# Patient Record
Sex: Male | Born: 1990 | Race: Black or African American | Hispanic: No | Marital: Single | Smoking: Former smoker
Health system: Southern US, Community
[De-identification: ages and names within clinical notes are randomized; demographics above are authoritative.]

## PROBLEM LIST (undated history)

## (undated) DIAGNOSIS — J45909 Unspecified asthma, uncomplicated: Secondary | ICD-10-CM

---
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
## 2011-05-13 ENCOUNTER — Emergency Department: Payer: Self-pay | Admitting: Emergency Medicine

## 2012-01-04 ENCOUNTER — Emergency Department: Payer: Self-pay | Admitting: Emergency Medicine

## 2012-01-05 LAB — COMPREHENSIVE METABOLIC PANEL
Albumin: 5.2 g/dL (ref 3.8–5.6)
Alkaline Phosphatase: 86 U/L — ABNORMAL LOW (ref 98–317)
Anion Gap: 13 (ref 7–16)
BUN: 18 mg/dL (ref 7–18)
Calcium, Total: 9.9 mg/dL (ref 9.0–10.7)
Creatinine: 1.95 mg/dL — ABNORMAL HIGH (ref 0.60–1.30)
Glucose: 112 mg/dL — ABNORMAL HIGH (ref 65–99)
Osmolality: 286 (ref 275–301)
Potassium: 3.3 mmol/L — ABNORMAL LOW (ref 3.5–5.1)
Sodium: 142 mmol/L (ref 136–145)
Total Protein: 8.3 g/dL (ref 6.4–8.6)

## 2012-01-05 LAB — URINALYSIS, COMPLETE
Leukocyte Esterase: NEGATIVE
Nitrite: NEGATIVE
Ph: 5 (ref 4.5–8.0)
Protein: 30
RBC,UR: 2 /HPF (ref 0–5)
Specific Gravity: 1.018 (ref 1.003–1.030)
WBC UR: 5 /HPF (ref 0–5)

## 2012-01-05 LAB — CBC
MCV: 91 fL (ref 80–100)
Platelet: 171 10*3/uL (ref 150–440)
RDW: 12.5 % (ref 11.5–14.5)
WBC: 20.7 10*3/uL — ABNORMAL HIGH (ref 3.8–10.6)

## 2012-01-05 LAB — BASIC METABOLIC PANEL
Anion Gap: 9 (ref 7–16)
BUN: 16 mg/dL (ref 7–18)
Calcium, Total: 8.2 mg/dL — ABNORMAL LOW (ref 9.0–10.7)
Chloride: 110 mmol/L — ABNORMAL HIGH (ref 98–107)
Co2: 25 mmol/L (ref 21–32)
Creatinine: 1.36 mg/dL — ABNORMAL HIGH (ref 0.60–1.30)
EGFR (African American): >60
EGFR (Non-African Amer.): >60
Glucose: 143 mg/dL — ABNORMAL HIGH (ref 65–99)
Osmolality: 290 (ref 275–301)

## 2012-01-05 LAB — CK: CK, Total: 277 U/L — ABNORMAL HIGH (ref 35–232)

## 2012-08-08 ENCOUNTER — Emergency Department: Payer: Self-pay | Admitting: Emergency Medicine

## 2012-08-08 LAB — URINALYSIS, COMPLETE
Glucose,UR: NEGATIVE mg/dL (ref 0–75)
Nitrite: NEGATIVE
Ph: 7 (ref 4.5–8.0)
Squamous Epithelial: <1

## 2013-09-30 ENCOUNTER — Emergency Department: Payer: Self-pay | Admitting: Emergency Medicine

## 2016-07-26 ENCOUNTER — Encounter: Payer: Self-pay | Admitting: Emergency Medicine

## 2016-07-26 ENCOUNTER — Emergency Department
Admission: EM | Admit: 2016-07-26 | Discharge: 2016-07-26 | Disposition: A | Discharge status: Home / Self Care | Payer: Self-pay | Attending: Emergency Medicine | Admitting: Emergency Medicine

## 2016-07-26 DIAGNOSIS — F17200 Nicotine dependence, unspecified, uncomplicated: Secondary | ICD-10-CM | POA: Insufficient documentation

## 2016-07-26 DIAGNOSIS — J111 Influenza due to unidentified influenza virus with other respiratory manifestations: Secondary | ICD-10-CM | POA: Insufficient documentation

## 2016-07-26 MED ORDER — OSELTAMIVIR PHOSPHATE 75 MG PO CAPS: 75.0000 mg | ORAL_CAPSULE | Freq: Two times a day (BID) | ORAL | 0 refills | Status: AC

## 2016-07-26 MED ORDER — GUAIFENESIN-CODEINE 100-10 MG/5ML PO SOLN: 5.0000 mL | ORAL | 0 refills | Status: DC | PRN

## 2016-07-26 NOTE — ED Notes (Signed)
Pt states sore throat, cough since Sunday. Congestion and back pain. Denies fever, N&V, diarrhea.

## 2016-07-26 NOTE — ED Provider Notes (Signed)
[facility] Emergency Department Provider Note  ____________________________________________   First MD Initiated Contact with Patient 07/26/16 1147     (approximate)  I have reviewed the triage vital signs and the nursing notes.   HISTORY  Chief Complaint flu like symptoms    HPI Francisco Mclaughlin is a 26 y.o. male is here with complaint of chills, cough, congestion, body aches and headache. Patient states that his symptoms began yesterday suddenly. He has been exposed to influenza. Patient denies any nausea, vomiting or diarrhea. Patient does continue to drink fluids. He has been taking over-the-counter medication for his symptoms without any improvement. He rates his pain as an 8 out of 10.   History reviewed. No pertinent past medical history.  There are no active problems to display for this patient.   History reviewed. No pertinent surgical history.  Prior to Admission medications   Medication Sig Start Date End Date Taking? Authorizing Provider  guaiFENesin-codeine 100-10 MG/5ML syrup Take 5 mLs by mouth every 4 (four) hours as needed. 07/26/16   Tommi Rumpshonda L Summers, PA-C  oseltamivir (TAMIFLU) 75 MG capsule Take 1 capsule (75 mg total) by mouth 2 (two) times daily. 07/26/16 07/31/16  Tommi Rumpshonda L Summers, PA-C    Allergies Patient has no allergy information on record.  History reviewed. No pertinent family history.  Social History Social History  Substance Use Topics  . Smoking status: Current Every Day Smoker  . Smokeless tobacco: Never Used  . Alcohol use Yes    Review of Systems Constitutional: No fever/Positive chills Eyes: No visual changes.  ENT: No sore throat. Positive nasal congestion. Cardiovascular: Denies chest pain. Respiratory: Denies shortness of breath. As a nonproductive cough. Gastrointestinal: No abdominal pain.  No nausea, no vomiting.  No diarrhea.   Musculoskeletal: Positive for generalized body aches. Skin: Negative  for rash. Neurological: Positive for headaches, focal weakness or numbness.  10-point ROS otherwise negative.  ____________________________________________   PHYSICAL EXAM:  VITAL SIGNS: ED Triage Vitals [07/26/16 1116]  Enc Vitals Group     BP 121/69     Pulse Rate 90     Resp 14     Temp 98.7 F (37.1 C)     Temp Source Oral     SpO2 98 %     Weight 150 lb (68 kg)     Height 5\' 7"  (1.702 m)     Head Circumference      Peak Flow      Pain Score 8     Pain Loc      Pain Edu?      Excl. in GC?     Constitutional: Alert and oriented. Well appearing and in no acute distress. Eyes: Conjunctivae are normal. PERRL. EOMI. Head: Atraumatic. Nose: Moderate congestion/rhinnorhea.  EACs and TMs are clear bilaterally. Mouth/Throat: Mucous membranes are moist.  Oropharynx non-erythematous. Neck: No stridor.  Hematological/Lymphatic/Immunilogical: No cervical lymphadenopathy. Cardiovascular: Normal rate, regular rhythm. Grossly normal heart sounds.  Good peripheral circulation. Respiratory: Normal respiratory effort.  No retractions. Lungs CTAB. Gastrointestinal: Soft and nontender. No distention. Musculoskeletal: Moves upper and lower extremities that any difficulty. Normal gait was noted. Neurologic:  Normal speech and language. No gross focal neurologic deficits are appreciated. No gait instability. Skin:  Skin is warm, dry and intact. No rash noted. Psychiatric: Mood and affect are normal. Speech and behavior are normal.  ____________________________________________   LABS (all labs ordered are listed, but only abnormal results are displayed)  Labs Reviewed - No  data to display   PROCEDURES  Procedure(s) performed: None  Procedures  Critical Care performed: No  ____________________________________________   INITIAL IMPRESSION / ASSESSMENT AND PLAN / ED COURSE  Pertinent labs & imaging results that were available during my care of the patient were reviewed by me  and considered in my medical decision making (see chart for details).  Patient was treated for influenza based on his symptoms and the sudden onset. Patient was given a prescription for Tamiflu 75 mg twice a day for 5 days.  Patient was also given prescription for Robitussin-AC as needed for cough and congestion. He is to follow-up with his primary care doctor or [facility] if any continued problems. He is also aware that he needs to increase fluids and continue taking Tylenol or ibuprofen as needed for body aches, fever, headache.      ____________________________________________   FINAL CLINICAL IMPRESSION(S) / ED DIAGNOSES  Final diagnoses:  Influenza      NEW MEDICATIONS STARTED DURING THIS VISIT:  Discharge Medication List as of 07/26/2016 12:11 PM    START taking these medications   Details  guaiFENesin-codeine 100-10 MG/5ML syrup Take 5 mLs by mouth every 4 (four) hours as needed., Starting Tue 07/26/2016, Print    oseltamivir (TAMIFLU) 75 MG capsule Take 1 capsule (75 mg total) by mouth 2 (two) times daily., Starting Tue 07/26/2016, Until Sun 07/31/2016, Print         Note:  This document was prepared using Dragon voice recognition software and may include unintentional dictation errors.    Tommi Rumps, PA-C 07/26/16 1459    Jene Every, MD 07/26/16 [phone]

## 2016-07-26 NOTE — ED Triage Notes (Signed)
C/o chills, cough, congestion body aches, and headache since Monday.  No distress at this time.

## 2016-07-26 NOTE — Discharge Instructions (Signed)
Follow-up with your primary care doctor if any continued problems. U am a also follow-up with [facility] if any continued problems. Increase fluids. Tylenol or ibuprofen as needed for pain, headache, muscle aches or fever. Begin taking Tamiflu 1 twice a day for 5 days. Robitussin-AC as needed for cough and congestion.

## 2016-10-09 ENCOUNTER — Emergency Department
Admission: EM | Admit: 2016-10-09 | Discharge: 2016-10-09 | Disposition: A | Discharge status: Home / Self Care | Payer: Self-pay | Attending: Student in an Organized Health Care Education/Training Program | Admitting: Student in an Organized Health Care Education/Training Program

## 2016-10-09 DIAGNOSIS — E860 Dehydration: Secondary | ICD-10-CM | POA: Insufficient documentation

## 2016-10-09 DIAGNOSIS — R112 Nausea with vomiting, unspecified: Secondary | ICD-10-CM

## 2016-10-09 DIAGNOSIS — F17200 Nicotine dependence, unspecified, uncomplicated: Secondary | ICD-10-CM | POA: Insufficient documentation

## 2016-10-09 DIAGNOSIS — R197 Diarrhea, unspecified: Secondary | ICD-10-CM

## 2016-10-09 LAB — URINALYSIS, COMPLETE (UACMP) WITH MICROSCOPIC
BACTERIA UA: NONE SEEN
BILIRUBIN URINE: NEGATIVE
Glucose, UA: NEGATIVE mg/dL
Hgb urine dipstick: NEGATIVE
KETONES UR: NEGATIVE mg/dL
LEUKOCYTES UA: NEGATIVE
Nitrite: NEGATIVE
PROTEIN: 100 mg/dL — AB
SQUAMOUS EPITHELIAL / LPF: NONE SEEN
Specific Gravity, Urine: 1.028 (ref 1.005–1.030)
pH: 7 (ref 5.0–8.0)

## 2016-10-09 LAB — CBC
HEMATOCRIT: 45.8 % (ref 40.0–52.0)
Hemoglobin: 15.6 g/dL (ref 13.0–18.0)
MCH: 31.3 pg (ref 26.0–34.0)
MCHC: 34 g/dL (ref 32.0–36.0)
MCV: 91.9 fL (ref 80.0–100.0)
Platelets: 184 10*3/uL (ref 150–440)
RBC: 4.99 MIL/uL (ref 4.40–5.90)
RDW: 13.5 % (ref 11.5–14.5)
WBC: 10.4 10*3/uL (ref 3.8–10.6)

## 2016-10-09 LAB — COMPREHENSIVE METABOLIC PANEL
ALT: 14 U/L — ABNORMAL LOW (ref 17–63)
AST: 35 U/L (ref 15–41)
Albumin: 5.1 g/dL — ABNORMAL HIGH (ref 3.5–5.0)
Alkaline Phosphatase: 55 U/L (ref 38–126)
Anion gap: 8 (ref 5–15)
BUN: 10 mg/dL (ref 6–20)
CHLORIDE: 105 mmol/L (ref 101–111)
CO2: 27 mmol/L (ref 22–32)
Calcium: 10 mg/dL (ref 8.9–10.3)
Creatinine, Ser: 0.82 mg/dL (ref 0.61–1.24)
GFR calc Af Amer: >60 mL/min (ref >60)
Glucose, Bld: 117 mg/dL — ABNORMAL HIGH (ref 65–99)
POTASSIUM: 4.5 mmol/L (ref 3.5–5.1)
SODIUM: 140 mmol/L (ref 135–145)
Total Bilirubin: 1.1 mg/dL (ref 0.3–1.2)
Total Protein: 7.8 g/dL (ref 6.5–8.1)

## 2016-10-09 LAB — LIPASE, BLOOD: LIPASE: 18 U/L (ref 11–51)

## 2016-10-09 LAB — INFLUENZA PANEL BY PCR (TYPE A & B)
INFLBPCR: NEGATIVE
Influenza A By PCR: NEGATIVE

## 2016-10-09 MED ORDER — PROMETHAZINE HCL 12.5 MG PO TABS: 12.5000 mg | ORAL_TABLET | Freq: Four times a day (QID) | ORAL | 0 refills | Status: DC | PRN

## 2016-10-09 MED ORDER — SODIUM CHLORIDE 0.9 % IV BOLUS (SEPSIS)
1000.0000 mL | Freq: Once | INTRAVENOUS | Status: AC
  Administered 2016-10-09: 1000 mL via INTRAVENOUS

## 2016-10-09 MED ORDER — ONDANSETRON HCL 4 MG/2ML IJ SOLN
4.0000 mg | Freq: Once | INTRAMUSCULAR | Status: AC | PRN
  Administered 2016-10-09: 4 mg via INTRAVENOUS
  Filled 2016-10-09: qty 2

## 2016-10-09 NOTE — ED Triage Notes (Signed)
Pt reports vomiting approx every 30 min since 0700 this am, c/o all over abd pain, reports weakness, unable to keep anything down, states diarrhea also

## 2016-10-09 NOTE — ED Provider Notes (Signed)
[facility] Emergency Department Provider Note    First MD Initiated Contact with Patient 10/09/16 1715     (approximate)  I have reviewed the triage vital signs and the nursing notes.   HISTORY  Chief Complaint Emesis and Diarrhea    HPI Francisco Mclaughlin is a 26 y.o. male presents with nausea vomiting and loose diarrhea that started this morning. It is described crampy abdominal pain. No measured fevers. No shortness of breath or cough. States he did have a lot to drink last night. Has not done before. Has not been able to keep anything down this morning. Was given a dose of Zofran in triage with improvement in symptoms.  Diarrhea has been nonbloody and non-melanotic.   PMH: previously healthy FMH: sickle cell disease, DM PSH: no recent surgeries There are no active problems to display for this patient.     Prior to Admission medications   Medication Sig Start Date End Date Taking? Authorizing Provider  guaiFENesin-codeine 100-10 MG/5ML syrup Take 5 mLs by mouth every 4 (four) hours as needed. 07/26/16   Tommi Rumps, PA-C  promethazine (PHENERGAN) 12.5 MG tablet Take 1 tablet (12.5 mg total) by mouth every 6 (six) hours as needed for nausea or vomiting. 10/09/16   Willy Eddy, MD    Allergies Patient has no known allergies.    Social History Social History  Substance Use Topics  . Smoking status: Current Every Day Smoker  . Smokeless tobacco: Never Used  . Alcohol use Yes    Review of Systems Patient denies headaches, rhinorrhea, blurry vision, numbness, shortness of breath, chest pain, edema, cough, abdominal pain, nausea, vomiting, diarrhea, dysuria, fevers, rashes or hallucinations unless otherwise stated above in HPI. ____________________________________________   PHYSICAL EXAM:  VITAL SIGNS: Vitals:   10/09/16 1525 10/09/16 1850  BP: 116/67 (!) 107/51  Pulse: 64 68  Resp: 18 17  Temp: 98.6 F (37 C)      Constitutional: Alert and oriented. Well appearing and in no acute distress. Eyes: Conjunctivae are normal. PERRL. EOMI. Head: Atraumatic. Nose: No congestion/rhinnorhea. Mouth/Throat: Mucous membranes are moist.  Oropharynx non-erythematous. Neck: No stridor. Painless ROM. No cervical spine tenderness to palpation Hematological/Lymphatic/Immunilogical: No cervical lymphadenopathy. Cardiovascular: Normal rate, regular rhythm. Grossly normal heart sounds.  Good peripheral circulation. Respiratory: Normal respiratory effort.  No retractions. Lungs CTAB. Gastrointestinal: Soft and nontender. No distention. No abdominal bruits. No CVA tenderness. Musculoskeletal: No lower extremity tenderness nor edema.  No joint effusions. Neurologic:  Normal speech and language. No gross focal neurologic deficits are appreciated. No gait instability. Skin:  Skin is warm, dry and intact. No rash noted. Psychiatric: Mood and affect are normal. Speech and behavior are normal.  ____________________________________________   LABS (all labs ordered are listed, but only abnormal results are displayed)  Results for orders placed or performed during the hospital encounter of 10/09/16 (from the past 24 hour(s))  Lipase, blood     Status: None   Collection Time: 10/09/16  3:29 PM  Result Value Ref Range   Lipase 18 11 - 51 U/L  Comprehensive metabolic panel     Status: Abnormal   Collection Time: 10/09/16  3:29 PM  Result Value Ref Range   Sodium 140 135 - 145 mmol/L   Potassium 4.5 3.5 - 5.1 mmol/L   Chloride 105 101 - 111 mmol/L   CO2 27 22 - 32 mmol/L   Glucose, Bld 117 (H) 65 - 99 mg/dL   BUN 10 6 -  20 mg/dL   Creatinine, Ser 1.61 0.61 - 1.24 mg/dL   Calcium 09.6 8.9 - 04.5 mg/dL   Total Protein 7.8 6.5 - 8.1 g/dL   Albumin 5.1 (H) 3.5 - 5.0 g/dL   AST 35 15 - 41 U/L   ALT 14 (L) 17 - 63 U/L   Alkaline Phosphatase 55 38 - 126 U/L   Total Bilirubin 1.1 0.3 - 1.2 mg/dL   GFR calc non Af Amer >60  >60 mL/min   GFR calc Af Amer >60 >60 mL/min   Anion gap 8 5 - 15  CBC     Status: None   Collection Time: 10/09/16  3:29 PM  Result Value Ref Range   WBC 10.4 3.8 - 10.6 K/uL   RBC 4.99 4.40 - 5.90 MIL/uL   Hemoglobin 15.6 13.0 - 18.0 g/dL   HCT 40.9 81.1 - 91.4 %   MCV 91.9 80.0 - 100.0 fL   MCH 31.3 26.0 - 34.0 pg   MCHC 34.0 32.0 - 36.0 g/dL   RDW 78.2 95.6 - 21.3 %   Platelets 184 150 - 440 K/uL  Urinalysis, Complete w Microscopic     Status: Abnormal   Collection Time: 10/09/16  3:29 PM  Result Value Ref Range   Color, Urine YELLOW (A) YELLOW   APPearance CLEAR (A) CLEAR   Specific Gravity, Urine 1.028 1.005 - 1.030   pH 7.0 5.0 - 8.0   Glucose, UA NEGATIVE NEGATIVE mg/dL   Hgb urine dipstick NEGATIVE NEGATIVE   Bilirubin Urine NEGATIVE NEGATIVE   Ketones, ur NEGATIVE NEGATIVE mg/dL   Protein, ur 086 (A) NEGATIVE mg/dL   Nitrite NEGATIVE NEGATIVE   Leukocytes, UA NEGATIVE NEGATIVE   RBC / HPF 0-5 0 - 5 RBC/hpf   WBC, UA 0-5 0 - 5 WBC/hpf   Bacteria, UA NONE SEEN NONE SEEN   Squamous Epithelial / LPF NONE SEEN NONE SEEN   Mucous PRESENT   Influenza panel by PCR (type A & B)     Status: None   Collection Time: 10/09/16  5:54 PM  Result Value Ref Range   Influenza A By PCR NEGATIVE NEGATIVE   Influenza B By PCR NEGATIVE NEGATIVE   ____________________________________________ ________________________________  RADIOLOGY   ____________________________________________   PROCEDURES  Procedure(s) performed:  Procedures    Critical Care performed: no ____________________________________________   INITIAL IMPRESSION / ASSESSMENT AND PLAN / ED COURSE  Pertinent labs & imaging results that were available during my care of the patient were reviewed by me and considered in my medical decision making (see chart for details).  DDX: dehydration, enteritis, fli, hangover  Francisco Mclaughlin is a 26 y.o. who presents to the ED with N/V/D as described above.   Patient is AFVSS in ED. Exam as above. Given current presentation have considered the above differential.  Patient's abdominal exam is soft and benign. Blood work is reassuring. Discussed that this is some component of viral enteritis however given his patient's alcohol use last night could simply be secondary to alcohol abuse. Significant improvement in symptoms after Zofran. We'll give IV fluid bolus. Given rapid onset of symptoms will check a flu.   Clinical Course as of Oct 09 1857  Sun Oct 09, 2016  5784 Patient was able to tolerate PO and was able to ambulate with a steady gait.  Stable for DC home.  Have discussed with the patient and available family all diagnostics and treatments performed thus far and all questions were answered to  the best of my ability. The patient demonstrates understanding and agreement with plan.    [PR]    Clinical Course User Index [PR] Willy Eddy, MD     ____________________________________________   FINAL CLINICAL IMPRESSION(S) / ED DIAGNOSES  Final diagnoses:  Nausea vomiting and diarrhea  Dehydration      NEW MEDICATIONS STARTED DURING THIS VISIT:  Discharge Medication List as of 10/09/2016  6:39 PM    START taking these medications   Details  promethazine (PHENERGAN) 12.5 MG tablet Take 1 tablet (12.5 mg total) by mouth every 6 (six) hours as needed for nausea or vomiting., Starting Sun 10/09/2016, Print         Note:  This document was prepared using Dragon voice recognition software and may include unintentional dictation errors.    Willy Eddy, MD 10/09/16 Windell Moment

## 2016-10-09 NOTE — Discharge Instructions (Signed)

## 2016-11-30 ENCOUNTER — Emergency Department
Admission: EM | Admit: 2016-11-30 | Discharge: 2016-12-01 | Disposition: A | Discharge status: Home / Self Care | Payer: Self-pay | Attending: Emergency Medicine | Admitting: Emergency Medicine

## 2016-11-30 ENCOUNTER — Emergency Department: Payer: Self-pay

## 2016-11-30 DIAGNOSIS — Y9367 Activity, basketball: Secondary | ICD-10-CM | POA: Insufficient documentation

## 2016-11-30 DIAGNOSIS — M62838 Other muscle spasm: Secondary | ICD-10-CM | POA: Insufficient documentation

## 2016-11-30 DIAGNOSIS — Y999 Unspecified external cause status: Secondary | ICD-10-CM | POA: Insufficient documentation

## 2016-11-30 DIAGNOSIS — X509XXA Other and unspecified overexertion or strenuous movements or postures, initial encounter: Secondary | ICD-10-CM | POA: Insufficient documentation

## 2016-11-30 DIAGNOSIS — F17200 Nicotine dependence, unspecified, uncomplicated: Secondary | ICD-10-CM | POA: Insufficient documentation

## 2016-11-30 DIAGNOSIS — Y929 Unspecified place or not applicable: Secondary | ICD-10-CM | POA: Insufficient documentation

## 2016-11-30 DIAGNOSIS — Y939 Activity, unspecified: Secondary | ICD-10-CM | POA: Insufficient documentation

## 2016-11-30 MED ORDER — CYCLOBENZAPRINE HCL 5 MG PO TABS: 5.0000 mg | ORAL_TABLET | Freq: Three times a day (TID) | ORAL | 0 refills | Status: AC | PRN

## 2016-11-30 MED ORDER — ORPHENADRINE CITRATE 30 MG/ML IJ SOLN: 60.0000 mg | Freq: Two times a day (BID) | INTRAMUSCULAR | Status: DC

## 2016-11-30 MED ORDER — MELOXICAM 7.5 MG PO TABS: 7.5000 mg | ORAL_TABLET | Freq: Every day | ORAL | 1 refills | Status: AC

## 2016-11-30 MED ORDER — CYCLOBENZAPRINE HCL 10 MG PO TABS
10.0000 mg | ORAL_TABLET | Freq: Once | ORAL | Status: AC
  Administered 2016-12-01: 10 mg via ORAL

## 2016-11-30 NOTE — ED Triage Notes (Signed)
Pt arrives to ED via POV with c/o neck pain that started 1.5 hrs PTA. Pt reports injury occurred while playing basketball and thinks he might have pulled a muscle in his neck.

## 2016-12-01 NOTE — ED Provider Notes (Signed)
[facility] Emergency Department Provider Note  ____________________________________________  Time seen: Approximately 3:58 PM  I have reviewed the triage vital signs and the nursing notes.   HISTORY  Chief Complaint Neck Pain    HPI Francisco Mclaughlin is a 26 y.o. male presenting to the emergency department with 10 on a 10 left-sided upper trapezius pain for the past 1-1/2 hours after patient was playing basketball. Patient denies numbness and tingling of the upper extremities, he denies changes in sensation or changes in temperature of the upper extremity. Patient denies weakness or bowel or bladder incontinence. Patient denies falls or prior surgeries to the neck. Patient has attempted no medications prior to presenting to the emergency department.   History reviewed. No pertinent past medical history.  There are no active problems to display for this patient.   History reviewed. No pertinent surgical history.  Prior to Admission medications   Medication Sig Start Date End Date Taking? Authorizing Provider  cyclobenzaprine (FLEXERIL) 5 MG tablet Take 1 tablet (5 mg total) by mouth 3 (three) times daily as needed for muscle spasms. 11/30/16 12/03/16  Orvil FeilWoods, Alicia Ackert M, PA-C  guaiFENesin-codeine 100-10 MG/5ML syrup Take 5 mLs by mouth every 4 (four) hours as needed. 07/26/16   Tommi RumpsSummers, Rhonda L, PA-C  meloxicam (MOBIC) 7.5 MG tablet Take 1 tablet (7.5 mg total) by mouth daily. 11/30/16 12/07/16  Orvil FeilWoods, Sevan Mcbroom M, PA-C  promethazine (PHENERGAN) 12.5 MG tablet Take 1 tablet (12.5 mg total) by mouth every 6 (six) hours as needed for nausea or vomiting. 10/09/16   Willy Eddyobinson, Patrick, MD    Allergies Patient has no known allergies.  No family history on file.  Social History Social History  Substance Use Topics  . Smoking status: Current Every Day Smoker  . Smokeless tobacco: Never Used  . Alcohol use Yes     Review of Systems  Constitutional: No  fever/chills Eyes: No visual changes. No discharge ENT: No upper respiratory complaints. Cardiovascular: no chest pain. Respiratory: no cough. No SOB. Gastrointestinal: No abdominal pain.  No nausea, no vomiting.  No diarrhea.  No constipation. Musculoskeletal: Patient has left sided upper trapezius pain.  Skin: Negative for rash, abrasions, lacerations, ecchymosis. Neurological: Negative for headaches, focal weakness or numbness.   ____________________________________________   PHYSICAL EXAM:  VITAL SIGNS: ED Triage Vitals  Enc Vitals Group     BP 11/30/16 2213 123/76     Pulse Rate 11/30/16 2213 72     Resp 11/30/16 2213 18     Temp 11/30/16 2213 98.4 F (36.9 C)     Temp Source 11/30/16 2213 Oral     SpO2 11/30/16 2213 97 %     Weight 11/30/16 2211 155 lb (70.3 kg)     Height 11/30/16 2211 5\' 8"  (1.727 m)     Head Circumference --      Peak Flow --      Pain Score 11/30/16 2210 10     Pain Loc --      Pain Edu? --      Excl. in GC? --      Constitutional: Alert and oriented. Well appearing and in no acute distress. Eyes: Conjunctivae are normal. PERRL. EOMI. Head: Atraumatic. Cardiovascular: Normal rate, regular rhythm. Normal S1 and S2.  Good peripheral circulation. Respiratory: Normal respiratory effort without tachypnea or retractions. Lungs CTAB. Good air entry to the bases with no decreased or absent breath sounds. Musculoskeletal: Patient is able to demonstrate limited range of motion at the  neck, likely secondary to pain. No pain was elicited with palpation along the cervical spine. Patient has 5 out of 5 strength in the upper extremities bilaterally. Neurologic:  Normal speech and language. No gross focal neurologic deficits are appreciated.  Skin:  Skin is warm, dry and intact. No rash noted. Psychiatric: Mood and affect are normal. Speech and behavior are normal. Patient exhibits appropriate insight and  judgement.   ____________________________________________   LABS (all labs ordered are listed, but only abnormal results are displayed)  Labs Reviewed - No data to display ____________________________________________  EKG   ____________________________________________  RADIOLOGY Geraldo Pitter, personally viewed and evaluated these images (plain radiographs) as part of my medical decision making, as well as reviewing the written report by the radiologist.  Dg Cervical Spine 2-3 Views  Result Date: 11/30/2016 CLINICAL DATA:  Acute onset of neck pain.  Initial encounter. EXAM: CERVICAL SPINE - 2-3 VIEW COMPARISON:  None. FINDINGS: There is no evidence of fracture or subluxation. There is slight developmental irregularity about C3-C4. Vertebral bodies demonstrate normal height and alignment. Intervertebral disc spaces are preserved. Prevertebral soft tissues are within normal limits. The provided odontoid view demonstrates no significant abnormality. The visualized lung apices are clear. IMPRESSION: No evidence of fracture or subluxation along the cervical spine. Electronically Signed   By: Roanna Raider M.D.   On: 11/30/2016 23:31    ____________________________________________    PROCEDURES  Procedure(s) performed:    Procedures    Medications  cyclobenzaprine (FLEXERIL) tablet 10 mg (10 mg Oral Given 12/01/16 0012)     ____________________________________________   INITIAL IMPRESSION / ASSESSMENT AND PLAN / ED COURSE  Pertinent labs & imaging results that were available during my care of the patient were reviewed by me and considered in my medical decision making (see chart for details).  Review of the [place] CSRS was performed in accordance of the [facility] prior to dispensing any controlled drugs.    Assessment and plan: Trapezius Muscle Spasm Patient presents to the emergency department with left upper trapezius pain after patient was playing basketball earlier in  the evening. DG cervical spine reveals no acute fractures or bony abnormalities. Patient refused an injection of Norflex in the emergency department. He was given PO Flexeril as an alternative. Patient was discharged with Flexeril and Mobic. Patient was advised to follow-up with his primary care provider as needed. Vital signs are reassuring prior to discharge.   ____________________________________________  FINAL CLINICAL IMPRESSION(S) / ED DIAGNOSES  Final diagnoses:  Trapezius muscle spasm      NEW MEDICATIONS STARTED DURING THIS VISIT:  Discharge Medication List as of 11/30/2016 11:49 PM    START taking these medications   Details  cyclobenzaprine (FLEXERIL) 5 MG tablet Take 1 tablet (5 mg total) by mouth 3 (three) times daily as needed for muscle spasms., Starting Wed 11/30/2016, Until Sat 12/03/2016, Print    meloxicam (MOBIC) 7.5 MG tablet Take 1 tablet (7.5 mg total) by mouth daily., Starting Wed 11/30/2016, Until Wed 12/07/2016, Print            This chart was dictated using voice recognition software/Dragon. Despite best efforts to proofread, errors can occur which can change the meaning. Any change was purely unintentional.    Orvil Feil, PA-C 12/01/16 1603    Charlynne Pander, MD 12/01/16 2014

## 2017-07-31 ENCOUNTER — Encounter: Payer: Self-pay | Admitting: Emergency Medicine

## 2017-07-31 ENCOUNTER — Other Ambulatory Visit: Payer: Self-pay

## 2017-07-31 ENCOUNTER — Emergency Department
Admission: EM | Admit: 2017-07-31 | Discharge: 2017-07-31 | Disposition: A | Discharge status: Home / Self Care | Payer: Self-pay | Attending: Emergency Medicine | Admitting: Emergency Medicine

## 2017-07-31 DIAGNOSIS — F17200 Nicotine dependence, unspecified, uncomplicated: Secondary | ICD-10-CM | POA: Insufficient documentation

## 2017-07-31 DIAGNOSIS — M7918 Myalgia, other site: Secondary | ICD-10-CM | POA: Insufficient documentation

## 2017-07-31 DIAGNOSIS — M79602 Pain in left arm: Secondary | ICD-10-CM

## 2017-07-31 MED ORDER — MELOXICAM 15 MG PO TABS: 15.0000 mg | ORAL_TABLET | Freq: Every day | ORAL | 0 refills | Status: DC

## 2017-07-31 NOTE — ED Triage Notes (Signed)
L shoulder pain intermittent x 8 months. Thinks originally hurt it playing basketball.

## 2017-07-31 NOTE — ED Notes (Signed)
ED Provider at bedside. 

## 2017-08-01 NOTE — ED Provider Notes (Signed)
[facility] Emergency Department Provider Note ____________________________________________  Time seen: Approximately 7:07 PM  I have reviewed the triage vital signs and the nursing notes.   HISTORY  Chief Complaint Shoulder Pain    HPI Francisco Mclaughlin is a 27 y.o. male who presents to the emergency department for treatment of chronic left shoulder pain that started about 8 months ago. 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 No specific injury. He was evaluated last week for the same, but did not receive a work note. He needs a note that shows he was here on that day. OTC medicated patches are working well to control pain.  History reviewed. No pertinent past medical history.  There are no active problems to display for this patient.   History reviewed. No pertinent surgical history.  Prior to Admission medications   Medication Sig Start Date End Date Taking? Authorizing Provider  guaiFENesin-codeine 100-10 MG/5ML syrup Take 5 mLs by mouth every 4 (four) hours as needed. 07/26/16   Tommi RumpsSummers, Rhonda L, PA-C  meloxicam (MOBIC) 15 MG tablet Take 1 tablet (15 mg total) by mouth daily. 07/31/17   Avin Gibbons, Kasandra Knudsenari B, FNP  promethazine (PHENERGAN) 12.5 MG tablet Take 1 tablet (12.5 mg total) by mouth every 6 (six) hours as needed for nausea or vomiting. 10/09/16   Willy Eddyobinson, Patrick, MD    Allergies Patient has no known allergies.  No family history on file.  Social History Social History   Tobacco Use  . Smoking status: Current Every Day Smoker  . Smokeless tobacco: Never Used  Substance Use Topics  . Alcohol use: Yes  . Drug use: Yes    Review of Systems Constitutional: Negative for acute in jury Cardiovascular: Negative for chest pain Respiratory: Negative for shortness of breath Musculoskeletal: Positive for left shoulder pain. Skin: Negative for wounds or lesions  Neurological: Negative for  paresthesias.  ____________________________________________   PHYSICAL EXAM:  VITAL SIGNS: ED Triage Vitals  Enc Vitals Group     BP 07/31/17 1839 127/75     Pulse Rate 07/31/17 1839 76     Resp 07/31/17 1839 20     Temp 07/31/17 1839 99.2 F (37.3 C)     Temp Source 07/31/17 1839 Oral     SpO2 07/31/17 1839 99 %     Weight 07/31/17 1839 155 lb (70.3 kg)     Height 07/31/17 1839 5\' 8"  (1.727 m)     Head Circumference --      Peak Flow --      Pain Score 07/31/17 1842 7     Pain Loc --      Pain Edu? --      Excl. in GC? --     Constitutional: Alert and oriented. Well appearing and in no acute distress. Eyes: Conjunctivae are clear without discharge or drainage Head: Atraumatic Neck: Negative for pain with movement. Supple. Respiratory: Respirations are even and unlabored. Musculoskeletal: No focal bony tenderness. Negative drop arm testing. Pain is diffusely worse with palpation over the musculature of the superior humerus and shoulder. Neurologic: Grip strength is equal.   Skin: Intact on exposed skin surface  Psychiatric: Affect and behavior are appropriate.  ____________________________________________   LABS (all labs ordered are listed, but only abnormal results are displayed)  Labs Reviewed - No data to display ____________________________________________  RADIOLOGY  Not indicated. ____________________________________________   PROCEDURES  Procedures  ____________________________________________   INITIAL IMPRESSION / ASSESSMENT AND PLAN / ED COURSE  Judie PetitMalcolm  RANGEL Mclaughlin is a 27 y.o. male who presents to the emergency department for a work note that will cover his last visit. He continues to have pain in the left shoulder, but has not changed or worsened since his last visit. He was given a prescription for Meloxicam and encouraged to follow up with the orthopedic doctor if symptoms are not improving with time and medication. He is to return to the ER  for symptoms that change or worsen if unable to schedule an appointment.   Medications - No data to display  Pertinent labs & imaging results that were available during my care of the patient were reviewed by me and considered in my medical decision making (see chart for details).  _________________________________________   FINAL CLINICAL IMPRESSION(S) / ED DIAGNOSES  Final diagnoses:  Musculoskeletal pain of left upper extremity    ED Discharge Orders        Ordered    meloxicam (MOBIC) 15 MG tablet  Daily     07/31/17 1938       If controlled substance prescribed during this visit, 12 month history viewed on the [facility] prior to issuing an initial prescription for Schedule II or III opiod.    Chinita Pester, FNP 08/01/17 1755    Arnaldo Natal, MD 08/02/17 [phone]

## 2017-12-03 DIAGNOSIS — R0602 Shortness of breath: Secondary | ICD-10-CM | POA: Insufficient documentation

## 2017-12-03 DIAGNOSIS — R0789 Other chest pain: Secondary | ICD-10-CM | POA: Insufficient documentation

## 2017-12-03 DIAGNOSIS — J45909 Unspecified asthma, uncomplicated: Secondary | ICD-10-CM | POA: Insufficient documentation

## 2017-12-03 DIAGNOSIS — F17210 Nicotine dependence, cigarettes, uncomplicated: Secondary | ICD-10-CM | POA: Insufficient documentation

## 2017-12-03 DIAGNOSIS — Z79899 Other long term (current) drug therapy: Secondary | ICD-10-CM | POA: Insufficient documentation

## 2017-12-04 ENCOUNTER — Encounter (HOSPITAL_COMMUNITY): Payer: Self-pay

## 2017-12-04 ENCOUNTER — Emergency Department (HOSPITAL_COMMUNITY): Payer: Self-pay

## 2017-12-04 ENCOUNTER — Emergency Department (HOSPITAL_COMMUNITY)
Admission: EM | Admit: 2017-12-04 | Discharge: 2017-12-04 | Disposition: A | Discharge status: Home / Self Care | Payer: Self-pay | Attending: Emergency Medicine | Admitting: Emergency Medicine

## 2017-12-04 ENCOUNTER — Other Ambulatory Visit: Payer: Self-pay

## 2017-12-04 DIAGNOSIS — R0602 Shortness of breath: Secondary | ICD-10-CM

## 2017-12-04 DIAGNOSIS — R0789 Other chest pain: Secondary | ICD-10-CM

## 2017-12-04 HISTORY — DX: Unspecified asthma, uncomplicated: J45.909

## 2017-12-04 MED ORDER — ALBUTEROL SULFATE HFA 108 (90 BASE) MCG/ACT IN AERS
2.0000 | INHALATION_SPRAY | RESPIRATORY_TRACT | Status: DC | PRN
  Administered 2017-12-04: 2 via RESPIRATORY_TRACT
  Filled 2017-12-04: qty 6.7

## 2017-12-04 NOTE — Discharge Instructions (Signed)
Use the Albuterol inhaler every 4-6 hours if needed for any sense of shortness of breath. Return here with any new or concerning symptoms.

## 2017-12-04 NOTE — ED Triage Notes (Signed)
States hx of asthma was playing basketball and now tightness in chest while playing basketball and shortness of breath does not have inhaler at home states feels ok but like he was kicked in chest.

## 2017-12-04 NOTE — ED Provider Notes (Signed)
[facility]-EMERGENCY DEPT Provider Note   CSN: [id number] Arrival date & time: 12/03/17  2338     History   Chief Complaint Chief Complaint  Patient presents with  . Shortness of Breath    HPI Francisco LenisMalcolm J Mclaughlin is a 27 y.o. male.  Patient with remote history of asthma presents with complaint of chest and back pain that started Mclaughlin playing basketball and caused mild shortness of breath. No cough, fever. No injury to chest wall or back. He went home and showered and felt the pain continued prompting ED evaluation. He states it feels like when near drowned in the past. No choking, apnea, cyanosis. No nausea or vomiting. Symptoms have improved over time and he is not having pain only in the mid-bilateral back.   The history is provided by the patient. No language interpreter was used.    Past Medical History:  Diagnosis Date  . Asthma     There are no active problems to display for this patient.   History reviewed. No pertinent surgical history.      Home Medications    Prior to Admission medications   Medication Sig Start Date End Date Taking? Authorizing Provider  guaiFENesin-codeine 100-10 MG/5ML syrup Take 5 mLs by mouth every 4 (four) hours as needed. 07/26/16   Tommi RumpsSummers, Rhonda L, PA-C  meloxicam (MOBIC) 15 MG tablet Take 1 tablet (15 mg total) by mouth daily. 07/31/17   Triplett, Kasandra Knudsenari B, FNP  promethazine (PHENERGAN) 12.5 MG tablet Take 1 tablet (12.5 mg total) by mouth every 6 (six) hours as needed for nausea or vomiting. 10/09/16   Willy Eddyobinson, Patrick, MD    Family History History reviewed. No pertinent family history.  Social History Social History   Tobacco Use  . Smoking status: Current Every Day Smoker  . Smokeless tobacco: Never Used  Substance Use Topics  . Alcohol use: Yes  . Drug use: Yes     Allergies   Patient has no known allergies.   Review of Systems Review of Systems  Constitutional: Negative for fever.  HENT:  Negative.   Respiratory: Positive for shortness of breath. Negative for apnea, cough and choking.   Cardiovascular: Positive for chest pain.  Gastrointestinal: Negative for abdominal pain, nausea and vomiting.  Musculoskeletal: Positive for back pain.  Skin: Negative.   Neurological: Negative.      Physical Exam Updated Vital Signs BP 124/85 (BP Location: Left Arm)   Pulse 84   Temp 98 F (36.7 C) (Oral)   Resp 16   Ht 5\' 8"  (1.727 m)   Wt 70.3 kg (155 lb)   SpO2 99%   BMI 23.57 kg/m   Physical Exam  Constitutional: He is oriented to person, place, and time. He appears well-developed and well-nourished.  HENT:  Head: Normocephalic.  Neck: Normal range of motion. Neck supple.  Cardiovascular: Normal rate and regular rhythm.  No murmur heard. Pulmonary/Chest: Effort normal. No respiratory distress. He has no wheezes. He has no rhonchi. He has no rales. He exhibits no tenderness.  Abdominal: Soft. Bowel sounds are normal. There is no tenderness. There is no rebound and no guarding.  Musculoskeletal: Normal range of motion.       Arms: Tenderness limited to upper right back.   Neurological: He is alert and oriented to person, place, and time.  Skin: Skin is warm and dry.  Psychiatric: He has a normal mood and affect.  Nursing note and vitals reviewed.    ED Treatments /  Results  Labs (all labs ordered are listed, but only abnormal results are displayed) Labs Reviewed - No data to display  EKG None  Radiology Dg Chest 2 View  Result Date: 12/04/2017 CLINICAL DATA:  Shortness of breath Mclaughlin playing basketball. History of asthma. EXAM: CHEST - 2 VIEW COMPARISON:  None. FINDINGS: Cardiomediastinal silhouette is normal. No pleural effusions or focal consolidations. Trachea projects midline and there is no pneumothorax. Soft tissue planes and included osseous structures are non-suspicious. IMPRESSION: Negative. Electronically Signed   By: Awilda Metro M.D.   On:  12/04/2017 01:31    Procedures Procedures (including critical care time)  Medications Ordered in ED Medications  albuterol (PROVENTIL HFA;VENTOLIN HFA) 108 (90 Base) MCG/ACT inhaler 2 puff (has no administration in time range)     Initial Impression / Assessment and Plan / ED Course  I have reviewed the triage vital signs and the nursing notes.  Pertinent labs & imaging results that were available during my care of the patient were reviewed by me and considered in my medical decision making (see chart for details).     Patient presents with chest and back pain with SOB that started Mclaughlin playing basketball and worsened later on. NO fever or cough.   CXR negative. VS normal including heart rate and O2 saturation. No tachypnea. He is very well appearing and in NAD.  Inhaler provided and he is given 2 puffs. He reports "it might have helped a little".   Doubt PTX or infectious process given normal CXR, normal VS. No persistent chest pain or pain consistent with pericarditis. Improving over time. Suspect musculoskeletal pain.   He can be discharged home. He will take the inhaler home and use it as or if needed. Follow up with PCP and return here with any change in symptoms or worsening pain/SOB.   Final Clinical Impressions(s) / ED Diagnoses   Final diagnoses:  None   1. SOB 2. Chest wall pain  ED Discharge Orders    None       Elpidio Anis, Cordelia Poche 12/04/17 1610    Glynn Octave, MD 12/04/17 [phone]

## 2017-12-04 NOTE — ED Notes (Signed)
Bed: WTR8 Expected date:  Expected time:  Means of arrival:  Comments: 

## 2018-01-03 ENCOUNTER — Encounter: Payer: Self-pay | Admitting: Emergency Medicine

## 2018-01-03 ENCOUNTER — Emergency Department: Payer: Self-pay

## 2018-01-03 ENCOUNTER — Other Ambulatory Visit: Payer: Self-pay

## 2018-01-03 ENCOUNTER — Emergency Department
Admission: EM | Admit: 2018-01-03 | Discharge: 2018-01-03 | Disposition: A | Discharge status: Home / Self Care | Payer: Self-pay | Attending: Emergency Medicine | Admitting: Emergency Medicine

## 2018-01-03 DIAGNOSIS — S93402A Sprain of unspecified ligament of left ankle, initial encounter: Secondary | ICD-10-CM | POA: Insufficient documentation

## 2018-01-03 DIAGNOSIS — Y9367 Activity, basketball: Secondary | ICD-10-CM | POA: Insufficient documentation

## 2018-01-03 DIAGNOSIS — F17210 Nicotine dependence, cigarettes, uncomplicated: Secondary | ICD-10-CM | POA: Insufficient documentation

## 2018-01-03 DIAGNOSIS — Y929 Unspecified place or not applicable: Secondary | ICD-10-CM | POA: Insufficient documentation

## 2018-01-03 DIAGNOSIS — X509XXA Other and unspecified overexertion or strenuous movements or postures, initial encounter: Secondary | ICD-10-CM | POA: Insufficient documentation

## 2018-01-03 DIAGNOSIS — Y999 Unspecified external cause status: Secondary | ICD-10-CM | POA: Insufficient documentation

## 2018-01-03 DIAGNOSIS — J45909 Unspecified asthma, uncomplicated: Secondary | ICD-10-CM | POA: Insufficient documentation

## 2018-01-03 DIAGNOSIS — Z79899 Other long term (current) drug therapy: Secondary | ICD-10-CM | POA: Insufficient documentation

## 2018-01-03 MED ORDER — TRAMADOL HCL 50 MG PO TABS: 50.0000 mg | ORAL_TABLET | Freq: Two times a day (BID) | ORAL | 0 refills | Status: DC | PRN

## 2018-01-03 MED ORDER — OXYCODONE-ACETAMINOPHEN 5-325 MG PO TABS
1.0000 | ORAL_TABLET | Freq: Once | ORAL | Status: AC
  Administered 2018-01-03: 1 via ORAL
  Filled 2018-01-03: qty 1

## 2018-01-03 MED ORDER — IBUPROFEN 600 MG PO TABS
600.0000 mg | ORAL_TABLET | Freq: Once | ORAL | Status: AC
  Administered 2018-01-03: 600 mg via ORAL
  Filled 2018-01-03: qty 1

## 2018-01-03 MED ORDER — IBUPROFEN 600 MG PO TABS: 600.0000 mg | ORAL_TABLET | Freq: Three times a day (TID) | ORAL | 0 refills | Status: DC | PRN

## 2018-01-03 NOTE — ED Provider Notes (Signed)
[facility] Emergency Department Provider Note   ____________________________________________   First MD Initiated Contact with Patient 01/03/18 1226     (approximate)  I have reviewed the triage vital signs and the nursing notes.   HISTORY  Chief Complaint Ankle Pain    HPI Francisco Mclaughlin is a 27 y.o. male left ankle pain and edema secondary to a twisting incident while playing basketball yesterday.  Patient denies loss of sensation.  Patient rates pain as a 10/10.  Patient described pain is "ache".  Patient the pain increases with weightbearing.  No palliative measures for complaint.  Past Medical History:  Diagnosis Date  . Asthma     There are no active problems to display for this patient.   History reviewed. No pertinent surgical history.  Prior to Admission medications   Medication Sig Start Date End Date Taking? Authorizing Provider  guaiFENesin-codeine 100-10 MG/5ML syrup Take 5 mLs by mouth every 4 (four) hours as needed. 07/26/16   Tommi Rumps, PA-C  ibuprofen (ADVIL,MOTRIN) 600 MG tablet Take 1 tablet (600 mg total) by mouth every 8 (eight) hours as needed. 01/03/18   Joni Reining, PA-C  meloxicam (MOBIC) 15 MG tablet Take 1 tablet (15 mg total) by mouth daily. 07/31/17   Triplett, Kasandra Knudsen, FNP  promethazine (PHENERGAN) 12.5 MG tablet Take 1 tablet (12.5 mg total) by mouth every 6 (six) hours as needed for nausea or vomiting. 10/09/16   Willy Eddy, MD  traMADol (ULTRAM) 50 MG tablet Take 1 tablet (50 mg total) by mouth every 12 (twelve) hours as needed. 01/03/18   Joni Reining, PA-C    Allergies Patient has no known allergies.  No family history on file.  Social History Social History   Tobacco Use  . Smoking status: Current Every Day Smoker  . Smokeless tobacco: Never Used  Substance Use Topics  . Alcohol use: Yes  . Drug use: Yes    Review of Systems Constitutional: No fever/chills Eyes: No visual  changes. ENT: No sore throat. Cardiovascular: Denies chest pain. Respiratory: Denies shortness of breath. Gastrointestinal: No abdominal pain.  No nausea, no vomiting.  No diarrhea.  No constipation. Genitourinary: Negative for dysuria. Musculoskeletal: Left ankle pain . skin: Negative for rash. Neurological: Negative for headaches, focal weakness or numbness.   ____________________________________________   PHYSICAL EXAM:  VITAL SIGNS: ED Triage Vitals [01/03/18 1231]  Enc Vitals Group     BP      Pulse      Resp      Temp      Temp src      SpO2      Weight 155 lb (70.3 kg)     Height 5\' 8"  (1.727 m)     Head Circumference      Peak Flow      Pain Score 10     Pain Loc      Pain Edu?      Excl. in GC?     Constitutional: Alert and oriented. Well appearing and in no acute distress. Cardiovascular: Normal rate, regular rhythm. Grossly normal heart sounds.  Good peripheral circulation. Respiratory: Normal respiratory effort.  No retractions. Lungs CTAB. Musculoskeletal: No obvious deformity to the left ankle.  There is moderate lateral edema.  Decreased range of motion with eversion of the back complain of pain.  Patient unable to weight-bear. Skin:  Skin is warm, dry and intact. No rash noted. Psychiatric: Mood and affect are normal. Speech  and behavior are normal.  ____________________________________________   LABS (all labs ordered are listed, but only abnormal results are displayed)  Labs Reviewed - No data to display ____________________________________________  EKG   ____________________________________________  RADIOLOGY  No acute findings on x-ray.  Official radiology report(s): Dg Ankle Complete Left  Result Date: 01/03/2018 CLINICAL DATA:  Left ankle pain after rolling it playing basketball. EXAM: LEFT ANKLE COMPLETE - 3+ VIEW COMPARISON:  Left foot x-rays dated October 06, 2004. FINDINGS: No acute fracture or dislocation. The ankle mortise is  symmetric. The talar dome is intact. Small tibiotalar joint effusion. Joint spaces are preserved. Possible os supratalare versus prominent anterior ankle capsular hypertrophy. Bone mineralization is normal. Moderate soft tissue swelling over the lateral malleolus. IMPRESSION: 1. Moderate soft tissue swelling over the lateral malleolus and small tibiotalar joint effusion. No acute osseous abnormality. Electronically Signed   By: Obie DredgeWilliam T Derry M.D.   On: 01/03/2018 13:25    ____________________________________________   PROCEDURES  Procedure(s) performed: None  Procedures  Critical Care performed: No  ____________________________________________   INITIAL IMPRESSION / ASSESSMENT AND PLAN / ED COURSE  As part of my medical decision making, I reviewed the following data within the electronic MEDICAL RECORD NUMBER    Left ankle pain secondary to sprain.  Discussed negative x-ray findings with patient.  Patient ankle was Ace wrapped and advised to ambulate with crutches.  Take medication as directed.     ____________________________________________   FINAL CLINICAL IMPRESSION(S) / ED DIAGNOSES  Final diagnoses:  Sprain of left ankle, unspecified ligament, initial encounter     ED Discharge Orders        Ordered    traMADol (ULTRAM) 50 MG tablet  Every 12 hours PRN     01/03/18 1339    ibuprofen (ADVIL,MOTRIN) 600 MG tablet  Every 8 hours PRN     01/03/18 1339       Note:  This document was prepared using Dragon voice recognition software and may include unintentional dictation errors.    Joni ReiningSmith, Lavante Toso K, PA-C 01/03/18 1345    Sharyn CreamerQuale, Mark, MD 01/03/18 [phone]

## 2018-01-03 NOTE — ED Notes (Signed)
Rolled left ankle playing basketball yesterday.  Now swelling esp on outside.  Good pulse.

## 2018-01-03 NOTE — ED Triage Notes (Signed)
Pt reports was playing basketball yesterday and rolled his left ankle. Swelling noted. Pt unable to put weight on it.

## 2018-01-03 NOTE — Discharge Instructions (Addendum)
Ambulate with crutches for 2 to 3 days.  Take medication and follow discharge care instructions.

## 2018-01-11 ENCOUNTER — Emergency Department (HOSPITAL_COMMUNITY)
Admission: EM | Admit: 2018-01-11 | Discharge: 2018-01-11 | Disposition: A | Discharge status: Home / Self Care | Payer: Self-pay | Attending: Emergency Medicine | Admitting: Emergency Medicine

## 2018-01-11 ENCOUNTER — Encounter (HOSPITAL_COMMUNITY): Payer: Self-pay | Admitting: Emergency Medicine

## 2018-01-11 DIAGNOSIS — F17200 Nicotine dependence, unspecified, uncomplicated: Secondary | ICD-10-CM | POA: Insufficient documentation

## 2018-01-11 DIAGNOSIS — Y92310 Basketball court as the place of occurrence of the external cause: Secondary | ICD-10-CM | POA: Insufficient documentation

## 2018-01-11 DIAGNOSIS — J45909 Unspecified asthma, uncomplicated: Secondary | ICD-10-CM | POA: Insufficient documentation

## 2018-01-11 DIAGNOSIS — W010XXA Fall on same level from slipping, tripping and stumbling without subsequent striking against object, initial encounter: Secondary | ICD-10-CM | POA: Insufficient documentation

## 2018-01-11 DIAGNOSIS — Y999 Unspecified external cause status: Secondary | ICD-10-CM | POA: Insufficient documentation

## 2018-01-11 DIAGNOSIS — S93402A Sprain of unspecified ligament of left ankle, initial encounter: Secondary | ICD-10-CM | POA: Insufficient documentation

## 2018-01-11 DIAGNOSIS — Y9367 Activity, basketball: Secondary | ICD-10-CM | POA: Insufficient documentation

## 2018-01-11 MED ORDER — DICLOFENAC SODIUM 75 MG PO TBEC: 75.0000 mg | DELAYED_RELEASE_TABLET | Freq: Two times a day (BID) | ORAL | 0 refills | Status: DC

## 2018-01-11 NOTE — Discharge Instructions (Addendum)
Return if any problems.  See the Orthopaedist if pain persist past one week  °

## 2018-01-11 NOTE — ED Triage Notes (Signed)
Pt was seen at [facility] for ankle injury on 7/17 when injured left ankle when playing basketball. Pt reports still painful. Pt come in on crutches and ankle wrapped in ACE bandage.

## 2018-01-12 NOTE — ED Provider Notes (Signed)
[facility]-EMERGENCY DEPT Provider Note   CSN: [id number] Arrival date & time: 01/11/18  1617     History   Chief Complaint Chief Complaint  Patient presents with  . Ankle Pain    HPI Francisco Mclaughlin is a 27 y.o. male.  The history is provided by the patient. No language interpreter was used.  Ankle Pain   The incident occurred more than 1 week ago. The incident occurred in the street. The injury mechanism was torsion. The quality of the pain is described as aching. The pain is moderate. The pain has been constant since onset. He reports no foreign bodies present. Nothing aggravates the symptoms. He has tried nothing for the symptoms.  Pt is walking.  He had an xray at [facility] that was negative.  Pt reports area is still sore,    Past Medical History:  Diagnosis Date  . Asthma     There are no active problems to display for this patient.   History reviewed. No pertinent surgical history.      Home Medications    Prior to Admission medications   Medication Sig Start Date End Date Taking? Authorizing Provider  diclofenac (VOLTAREN) 75 MG EC tablet Take 1 tablet (75 mg total) by mouth 2 (two) times daily. 01/11/18   Elson Areas, PA-C    Family History No family history on file.  Social History Social History   Tobacco Use  . Smoking status: Current Every Day Smoker  . Smokeless tobacco: Never Used  Substance Use Topics  . Alcohol use: Yes  . Drug use: Yes     Allergies   Patient has no known allergies.   Review of Systems Review of Systems  All other systems reviewed and are negative.    Physical Exam Updated Vital Signs BP 129/83 (BP Location: Left Arm)   Pulse 66   Temp 98.4 F (36.9 C) (Oral)   Resp 16   SpO2 100%   Physical Exam  Constitutional: He is oriented to person, place, and time. He appears well-developed and well-nourished.  HENT:  Head: Normocephalic.  Musculoskeletal: Normal range of motion. He  exhibits tenderness.  Swollen left ankle, from nv and ns intact   Neurological: He is alert and oriented to person, place, and time.  Skin: Skin is warm.  Psychiatric: He has a normal mood and affect.  Nursing note and vitals reviewed.    ED Treatments / Results  Labs (all labs ordered are listed, but only abnormal results are displayed) Labs Reviewed - No data to display  EKG None  Radiology No results found.  Procedures Procedures (including critical care time)  Medications Ordered in ED Medications - No data to display   Initial Impression / Assessment and Plan / ED Course  I have reviewed the triage vital signs and the nursing notes.  Pertinent labs & imaging results that were available during my care of the patient were reviewed by me and considered in my medical decision making (see chart for details).    Pt placed in an aso.  Pt advised to follow up with Orthopaedist for recheck in 1 week.    Final Clinical Impressions(s) / ED Diagnoses   Final diagnoses:  Sprain of left ankle, unspecified ligament, initial encounter    ED Discharge Orders        Ordered    diclofenac (VOLTAREN) 75 MG EC tablet  2 times daily     01/11/18 1711    An  After Visit Summary was printed and given to the patient.   Elson AreasSofia, Elizabeth Paulsen K, [place] JerseyPA-C 01/12/18 1643    Linwood DibblesKnapp, Jon, MD 01/15/18 [phone]

## 2018-02-21 ENCOUNTER — Telehealth: Payer: Self-pay | Admitting: Internal Medicine

## 2018-02-21 NOTE — Telephone Encounter (Signed)
Pt called to request an appointment with his PCP. Scheduled the appointment for 02/22/2018 at 7:30 PM. Pt expressed understanding.

## 2018-02-21 NOTE — Telephone Encounter (Signed)
Returned call to pt after checking pt's record to confirm eligibility to be seen at the [facility]. Informed pt that we did not have any records for him on file for the [facility]. Explained criteria to be seen at the [facility] to the pt and instructed him to bring in a current [place] Driver's License, letter of support/current utility bill, pay stubs/bank account statements, and 1040 tax forms (if applicable) to his appointment tomorrow. Pt expressed his understanding of the information provided to him today.

## 2018-02-22 ENCOUNTER — Ambulatory Visit: Payer: Self-pay

## 2018-06-19 LAB — HM HIV SCREENING LAB: HM HIV Screening: NEGATIVE

## 2018-06-30 IMAGING — CR DG CERVICAL SPINE 2 OR 3 VIEWS
1 series · 6 of 6 positions shown · non-contrast
Comparison: None.

CLINICAL DATA: Acute onset of neck pain.  Initial encounter.

EXAM:
CERVICAL SPINE - 2-3 VIEW

[Series 1: dg cervical spine 2 or 3 views · 0.14mm/px · 6 of 6 slices shown]
[im 1/6]
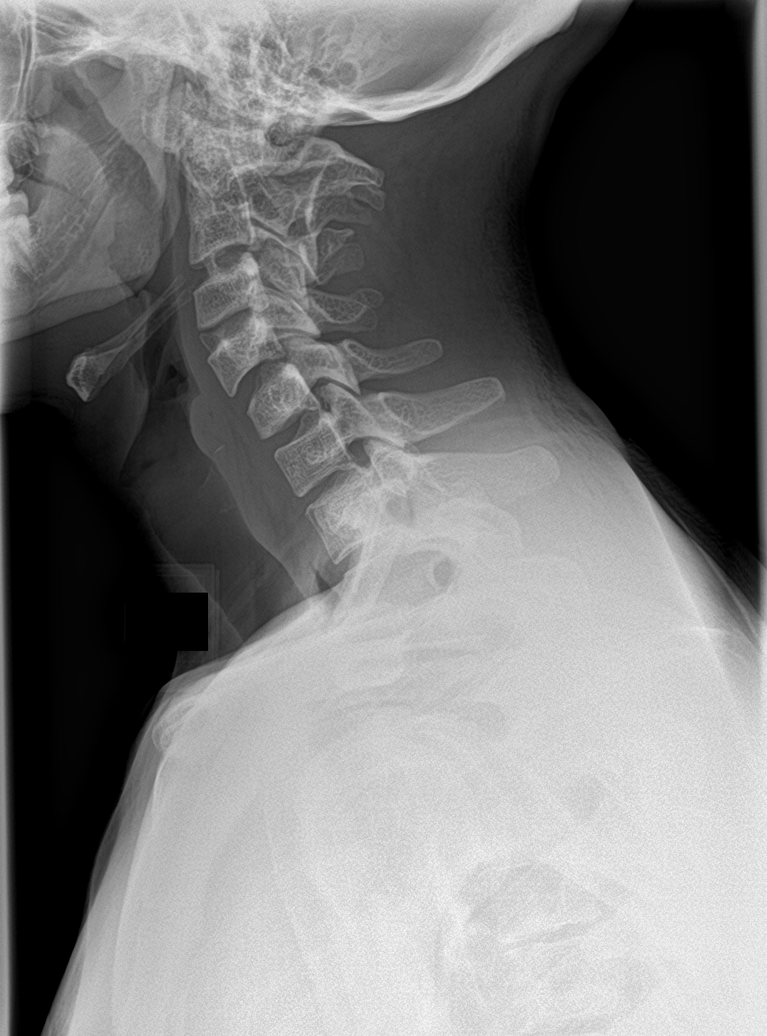
[im 2/6]
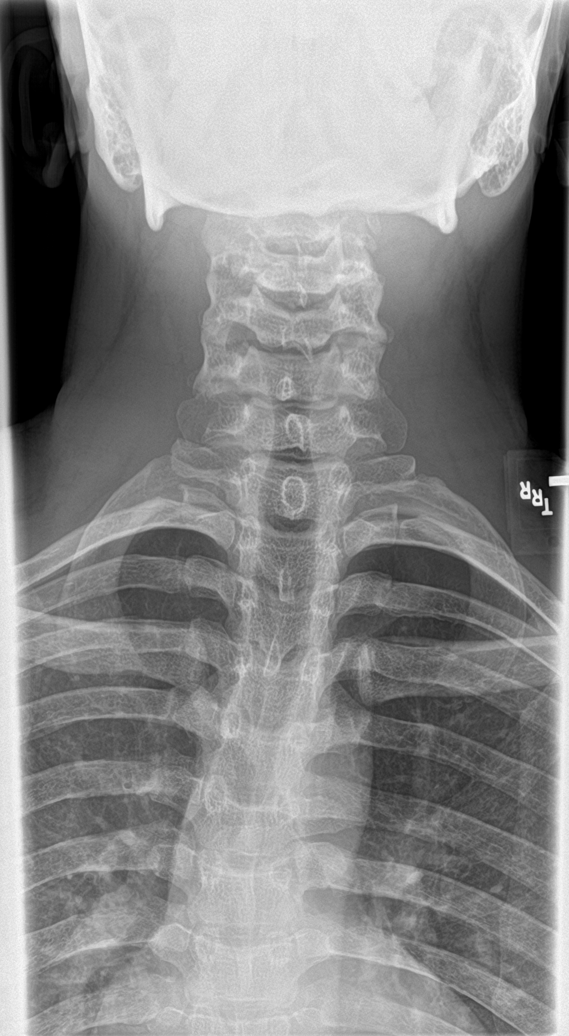
[im 3/6]
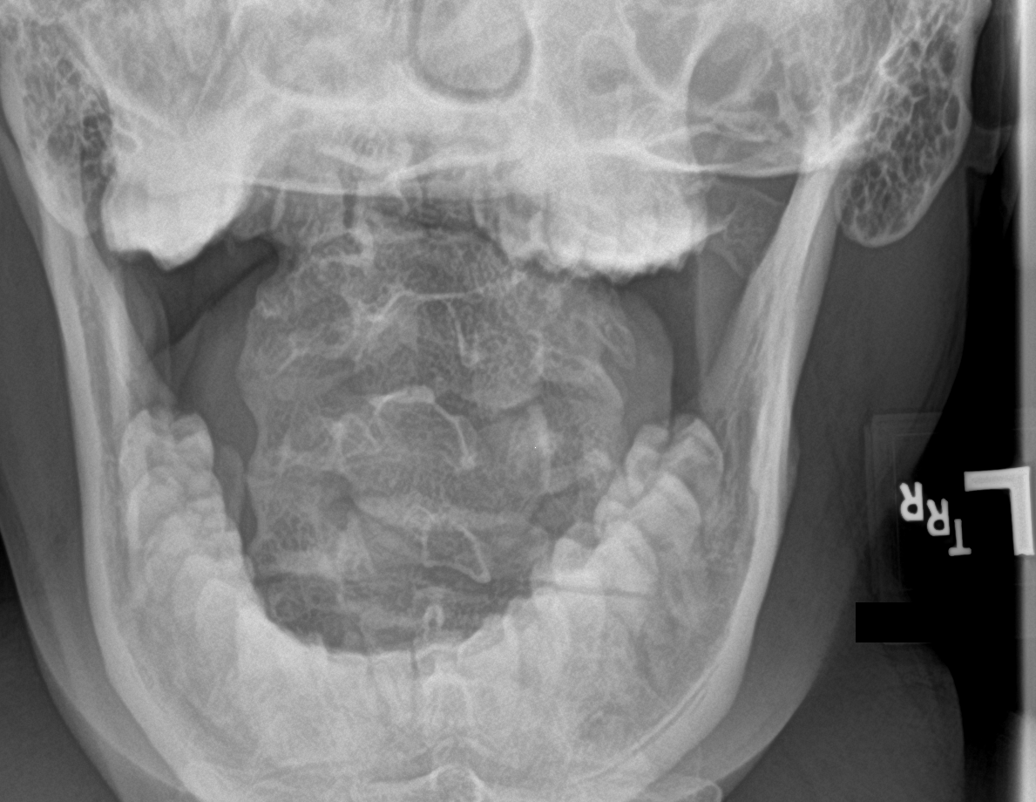
[im 4/6]
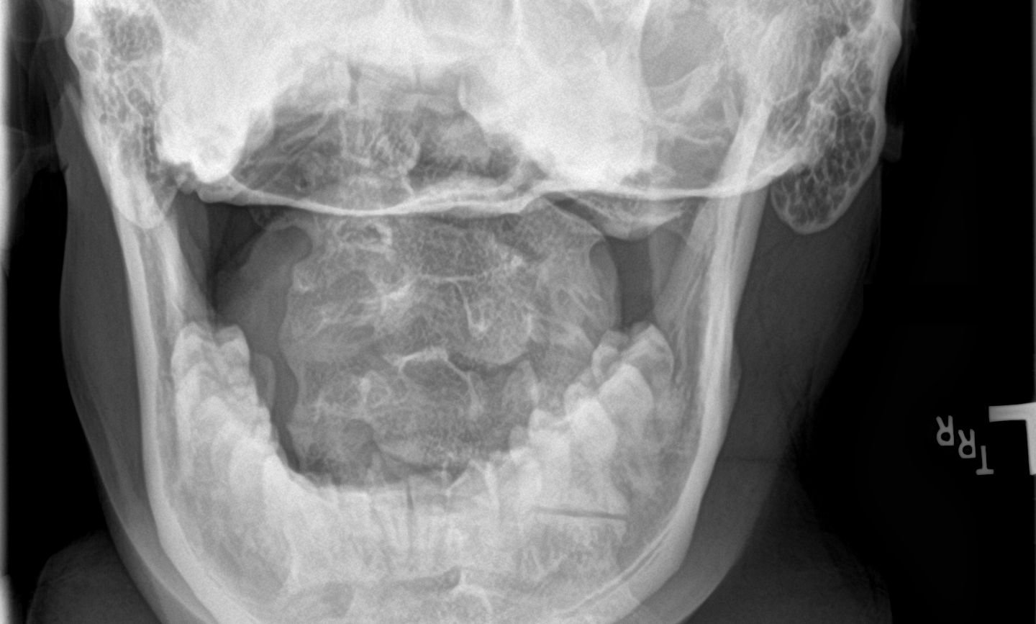
[im 5/6]
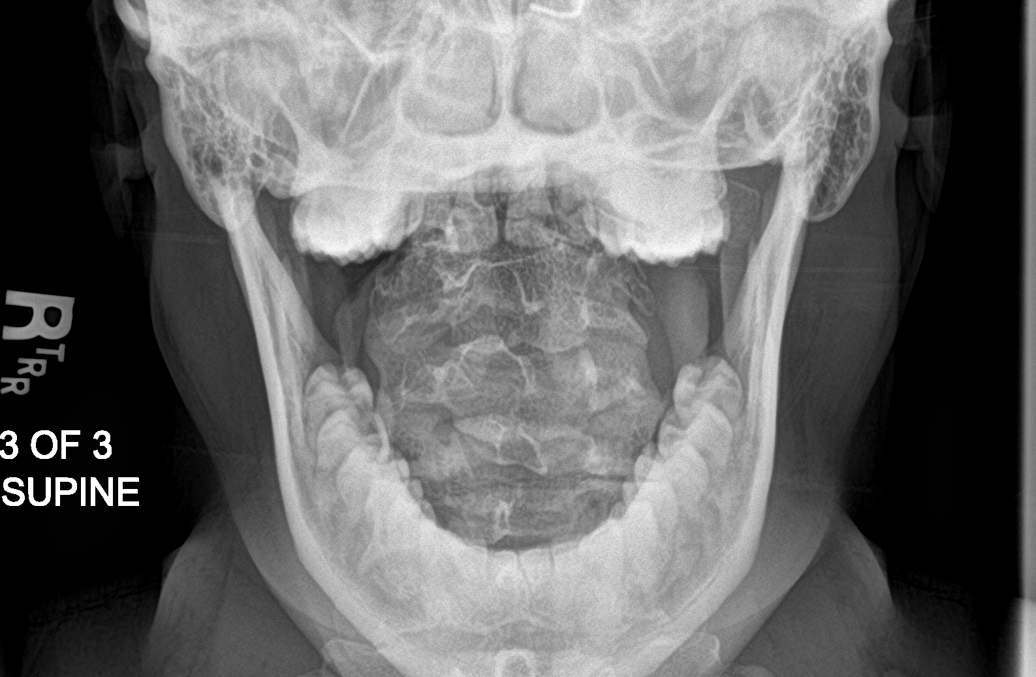
[im 6/6]
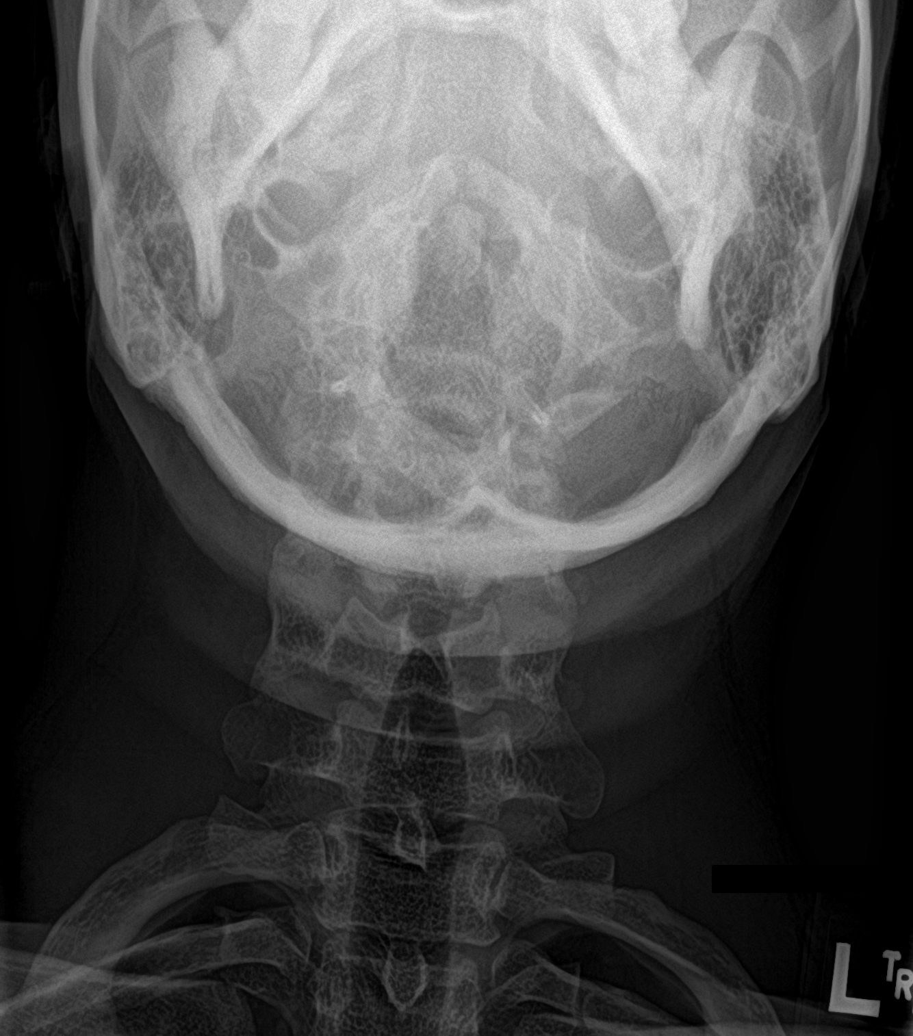

[6 of 6 positions shown; findings below may reference images not displayed]

FINDINGS: There is no evidence of fracture or subluxation. There is slight
developmental irregularity about C3-C4. Vertebral bodies demonstrate
normal height and alignment. Intervertebral disc spaces are
preserved. Prevertebral soft tissues are within normal limits. The
provided odontoid view demonstrates no significant abnormality.

The visualized lung apices are clear.
IMPRESSION: No evidence of fracture or subluxation along the cervical spine.

## 2018-10-19 ENCOUNTER — Encounter: Payer: Self-pay | Admitting: Emergency Medicine

## 2018-10-19 ENCOUNTER — Other Ambulatory Visit: Payer: Self-pay

## 2018-10-19 ENCOUNTER — Ambulatory Visit
Admission: EM | Admit: 2018-10-19 | Discharge: 2018-10-19 | Disposition: A | Discharge status: Home / Self Care | Payer: Self-pay | Attending: Family Medicine | Admitting: Family Medicine

## 2018-10-19 DIAGNOSIS — K047 Periapical abscess without sinus: Secondary | ICD-10-CM

## 2018-10-19 DIAGNOSIS — K0889 Other specified disorders of teeth and supporting structures: Secondary | ICD-10-CM

## 2018-10-19 MED ORDER — AMOXICILLIN-POT CLAVULANATE 875-125 MG PO TABS: 1.0000 | ORAL_TABLET | Freq: Two times a day (BID) | ORAL | 0 refills | Status: DC

## 2018-10-19 NOTE — Discharge Instructions (Addendum)
Take medication as prescribed. Rest. Drink plenty of fluids.  Rinse mouth frequently.  Soft food diet.  Over-the-counter Tylenol and ibuprofen as needed.  Follow-up with dentist as soon as possible, as discussed.  Follow up with your primary care physician this week as needed. Return to Urgent care for new or worsening concerns.

## 2018-10-19 NOTE — ED Provider Notes (Signed)
[facility] ____________________________________________  Time seen: Approximately 4:50 PM  I have reviewed the triage vital signs and the nursing notes.   HISTORY  Chief Complaint Dental Pain    HPI Francisco Mclaughlin is a [age] y.o. male presenting for evaluation of left inner upper dental pain present since earlier today.  States he has had a broken tooth to that area for a years without any difficulty, but states pain to started today.  Denies injury.  Denies recent sickness, fever, cough, congestion or other complaints.  States pain is mild to moderate.  Has not taken any over-the-counter medication for same complaints.  Denies alleviating measures.  Denies aggravating factors.  No recent antibiotic use.  Denies other complaints.    Past Medical History:  Diagnosis Date  . Asthma     There are no active problems to display for this patient.   History reviewed. No pertinent surgical history.  Current Outpatient Rx  . Order #: [id number] Class: Normal    Allergies Patient has no known allergies.  Family History  Family history unknown: Yes    Social History Social History   Tobacco Use  . Smoking status: Former Games developermoker  . Smokeless tobacco: Never Used  Substance Use Topics  . Alcohol use: Yes  . Drug use: Yes    Types: Marijuana    Review of Systems Constitutional: No fever/chills. Reports continues to eat and drink foods and fluids well.  ENT: No sore throat. Positive dental pain.  Cardiovascular: Denies chest pain. Respiratory: Denies shortness of breath. Gastrointestinal: No abdominal pain.  No nausea, no vomiting.  Skin: Negative for rash.   ____________________________________________   PHYSICAL EXAM:  VITAL SIGNS: ED Triage Vitals  Enc Vitals Group     BP 10/19/18 1624 119/78     Pulse Rate 10/19/18 1624 81     Resp 10/19/18 1624 16     Temp 10/19/18 1624 98.3 F (36.8 C)     Temp Source 10/19/18 1624 Oral     SpO2 10/19/18  1624 99 %     Weight 10/19/18 1621 160 lb (72.6 kg)     Height 10/19/18 1621 5\' 8"  (1.727 m)     Head Circumference --      Peak Flow --      Pain Score 10/19/18 1621 10     Pain Loc --      Pain Edu? --      Excl. in GC? --     Constitutional: Alert and oriented. Well appearing and in no acute distress. Head: Atraumatic.  No edema, erythema or induration noted. Nose: No congestion/rhinnorhea. Mouth/Throat: Mucous membranes are moist.  Oropharynx non-erythematous. Periodontal Exam   Several dental caries and fractured teeth noted.  Left upper tooth #15 with dental carry present that is large with surrounding gumline erythema and tenderness, no visible or palpated abscess, mild tenderness to direct palpation. Neck: No stridor.  Hematological/Lymphatic/Immunilogical: No cervical lymphadenopathy. Cardiovascular:   Normal rate, regular rhythm. Grossly normal heart sounds. Good peripheral circulation. Respiratory: Normal respiratory effort.  No retractions. Musculoskeletal: Steady gait. Neurologic:  Normal speech and language. Speech is normal. No gait instability. Skin:  Skin is warm, dry and intact. No rash noted. Psychiatric: Mood and affect are normal. Speech and behavior are normal.  ____________________________________________   LABS (all labs ordered are listed, but only abnormal results are displayed)  Labs Reviewed - No data to display ____________________________________________   INITIAL IMPRESSION / ASSESSMENT AND PLAN / ED COURSE  Pertinent  labs & imaging results that were available during my care of the patient were reviewed by me and considered in my medical decision making (see chart for details).  Well-appearing patient.  No acute distress.  Left upper dental pain, suspect secondary infection from dental caries.  Will treat with oral Augmentin.  Over-the-counter Tylenol ibuprofen as needed.  Recommend follow-up with dentist as soon as possible.  Patient agreed to  this plan.  Discussed follow-up and return parameters.  ____________________________________________   FINAL CLINICAL IMPRESSION(S) / ED DIAGNOSES  Final diagnoses:  Pain, dental  Dental infection         Renford Dills, NP 10/19/18 1803

## 2018-10-19 NOTE — ED Triage Notes (Signed)
Patient c/o left sided upper tooth pain off and on for a year but states that the pain started back today.  Patient denies fevers.

## 2019-01-22 ENCOUNTER — Ambulatory Visit: Payer: Self-pay

## 2019-02-14 ENCOUNTER — Ambulatory Visit: Payer: Self-pay

## 2019-05-08 ENCOUNTER — Other Ambulatory Visit: Payer: Self-pay

## 2019-05-08 ENCOUNTER — Ambulatory Visit: Payer: Self-pay | Admitting: Family Medicine

## 2019-05-08 DIAGNOSIS — Z5321 Procedure and treatment not carried out due to patient leaving prior to being seen by health care provider: Secondary | ICD-10-CM

## 2019-05-08 NOTE — Progress Notes (Signed)
Went to room pt not seen. Waited 30 minutes and checked again, pt not present. Confirmed with nursing staff he appears to have left.

## 2019-07-26 ENCOUNTER — Ambulatory Visit: Payer: Self-pay

## 2019-10-22 ENCOUNTER — Ambulatory Visit: Payer: Self-pay

## 2019-10-23 ENCOUNTER — Ambulatory Visit: Payer: Self-pay

## 2019-10-24 ENCOUNTER — Ambulatory Visit: Payer: Self-pay

## 2020-09-30 ENCOUNTER — Emergency Department
Admission: EM | Admit: 2020-09-30 | Discharge: 2020-10-01 | Disposition: A | Discharge status: Home / Self Care | Payer: Self-pay | Attending: Emergency Medicine | Admitting: Emergency Medicine

## 2020-09-30 ENCOUNTER — Other Ambulatory Visit: Payer: Self-pay

## 2020-09-30 DIAGNOSIS — Z87891 Personal history of nicotine dependence: Secondary | ICD-10-CM | POA: Insufficient documentation

## 2020-09-30 DIAGNOSIS — R112 Nausea with vomiting, unspecified: Secondary | ICD-10-CM | POA: Insufficient documentation

## 2020-09-30 DIAGNOSIS — J45909 Unspecified asthma, uncomplicated: Secondary | ICD-10-CM | POA: Insufficient documentation

## 2020-09-30 DIAGNOSIS — R109 Unspecified abdominal pain: Secondary | ICD-10-CM | POA: Insufficient documentation

## 2020-09-30 LAB — CBC WITH DIFFERENTIAL/PLATELET
Abs Immature Granulocytes: 0.04 10*3/uL (ref 0.00–0.07)
Basophils Absolute: 0 10*3/uL (ref 0.0–0.1)
Basophils Relative: 0 %
Eosinophils Absolute: 0 10*3/uL (ref 0.0–0.5)
Eosinophils Relative: 0 %
HCT: 46.7 % (ref 39.0–52.0)
Hemoglobin: 16.2 g/dL (ref 13.0–17.0)
Immature Granulocytes: 0 %
Lymphocytes Relative: 5 %
Lymphs Abs: 0.6 10*3/uL — ABNORMAL LOW (ref 0.7–4.0)
MCH: 31.7 pg (ref 26.0–34.0)
MCHC: 34.7 g/dL (ref 30.0–36.0)
MCV: 91.4 fL (ref 80.0–100.0)
Monocytes Absolute: 0.6 10*3/uL (ref 0.1–1.0)
Monocytes Relative: 5 %
Neutro Abs: 10.9 10*3/uL — ABNORMAL HIGH (ref 1.7–7.7)
Neutrophils Relative %: 90 %
Platelets: 165 10*3/uL (ref 150–400)
RBC: 5.11 MIL/uL (ref 4.22–5.81)
RDW: 12 % (ref 11.5–15.5)
WBC: 12.1 10*3/uL — ABNORMAL HIGH (ref 4.0–10.5)
nRBC: 0 % (ref 0.0–0.2)

## 2020-09-30 LAB — URINALYSIS, ROUTINE W REFLEX MICROSCOPIC
Bilirubin Urine: NEGATIVE
Glucose, UA: NEGATIVE mg/dL
Hgb urine dipstick: NEGATIVE
Ketones, ur: NEGATIVE mg/dL
Leukocytes,Ua: NEGATIVE
Nitrite: NEGATIVE
Protein, ur: NEGATIVE mg/dL
Specific Gravity, Urine: 1.029 (ref 1.005–1.030)
pH: 6 (ref 5.0–8.0)

## 2020-09-30 LAB — BASIC METABOLIC PANEL
Anion gap: 12 (ref 5–15)
BUN: 15 mg/dL (ref 6–20)
CO2: 26 mmol/L (ref 22–32)
Calcium: 10 mg/dL (ref 8.9–10.3)
Chloride: 101 mmol/L (ref 98–111)
Creatinine, Ser: 0.97 mg/dL (ref 0.61–1.24)
GFR, Estimated: >60 mL/min (ref >60)
Glucose, Bld: 120 mg/dL — ABNORMAL HIGH (ref 70–99)
Potassium: 4.2 mmol/L (ref 3.5–5.1)
Sodium: 139 mmol/L (ref 135–145)

## 2020-09-30 LAB — LIPASE, BLOOD: Lipase: 31 U/L (ref 11–51)

## 2020-09-30 LAB — HEPATIC FUNCTION PANEL
ALT: 35 U/L (ref 0–44)
AST: 46 U/L — ABNORMAL HIGH (ref 15–41)
Albumin: 4.9 g/dL (ref 3.5–5.0)
Alkaline Phosphatase: 50 U/L (ref 38–126)
Bilirubin, Direct: 0.2 mg/dL (ref 0.0–0.2)
Indirect Bilirubin: 1.3 mg/dL — ABNORMAL HIGH (ref 0.3–0.9)
Total Bilirubin: 1.5 mg/dL — ABNORMAL HIGH (ref 0.3–1.2)
Total Protein: 7.7 g/dL (ref 6.5–8.1)

## 2020-09-30 MED ORDER — ONDANSETRON HCL 4 MG/2ML IJ SOLN
4.0000 mg | Freq: Once | INTRAMUSCULAR | Status: AC
  Administered 2020-09-30: 4 mg via INTRAVENOUS
  Filled 2020-09-30: qty 2

## 2020-09-30 MED ORDER — SODIUM CHLORIDE 0.9 % IV BOLUS
1000.0000 mL | Freq: Once | INTRAVENOUS | Status: AC
  Administered 2020-09-30: 1000 mL via INTRAVENOUS

## 2020-09-30 NOTE — ED Provider Notes (Signed)
[facility] Emergency Department Provider Note   ____________________________________________   Event Date/Time   First MD Initiated Contact with Patient 09/30/20 2302     (approximate)  I have reviewed the triage vital signs and the nursing notes.   HISTORY  Chief Complaint Emesis and Abdominal Pain    HPI Francisco Mclaughlin is a 30 y.o. male who presents to the ED from home with a chief complaint of abdominal pain, nausea and vomiting.  Symptoms started today.  Reports approximately 5 episodes of emesis.  Denies associated diarrhea.  Denies fever, cough, chest pain, shortness of breath, dysuria.     Past Medical History:  Diagnosis Date  . Asthma     There are no problems to display for this patient.   History reviewed. No pertinent surgical history.  Prior to Admission medications   Medication Sig Start Date End Date Taking? Authorizing Provider  ondansetron (ZOFRAN ODT) 4 MG disintegrating tablet Take 1 tablet (4 mg total) by mouth every 8 (eight) hours as needed for nausea or vomiting. 10/01/20  Yes Irean Hong, MD  amoxicillin-clavulanate (AUGMENTIN) 875-125 MG tablet Take 1 tablet by mouth every 12 (twelve) hours. 10/19/18   Renford Dills, NP    Allergies Patient has no known allergies.  Family History  Family history unknown: Yes    Social History Social History   Tobacco Use  . Smoking status: Former Games developer  . Smokeless tobacco: Never Used  Vaping Use  . Vaping Use: Former  Substance Use Topics  . Alcohol use: Yes  . Drug use: Yes    Types: Marijuana    Review of Systems  Constitutional: No fever/chills Eyes: No visual changes. ENT: No sore throat. Cardiovascular: Denies chest pain. Respiratory: Denies shortness of breath. Gastrointestinal: Positive for abdominal pain, nausea and vomiting.  No diarrhea.  No constipation. Genitourinary: Negative for dysuria. Musculoskeletal: Negative for back pain. Skin: Negative  for rash. Neurological: Negative for headaches, focal weakness or numbness.   ____________________________________________   PHYSICAL EXAM:  VITAL SIGNS: ED Triage Vitals  Enc Vitals Group     BP 09/30/20 2056 125/80     Pulse Rate 09/30/20 2056 82     Resp 09/30/20 2056 18     Temp 09/30/20 2056 99 F (37.2 C)     Temp src --      SpO2 09/30/20 2056 99 %     Weight 09/30/20 2055 160 lb (72.6 kg)     Height 09/30/20 2055 5\' 8"  (1.727 m)     Head Circumference --      Peak Flow --      Pain Score 09/30/20 2141 6     Pain Loc --      Pain Edu? --      Excl. in GC? --     Constitutional: Alert and oriented. Well appearing and in no acute distress. Eyes: Conjunctivae are normal. PERRL. EOMI. Head: Atraumatic. Nose: No congestion/rhinnorhea. Mouth/Throat: Mucous membranes are mildly dry. Neck: No stridor.   Cardiovascular: Normal rate, regular rhythm. Grossly normal heart sounds.  Good peripheral circulation. Respiratory: Normal respiratory effort.  No retractions. Lungs CTAB. Gastrointestinal: Soft and nontender to light or deep palpation. No distention. No abdominal bruits. No CVA tenderness. Musculoskeletal: No lower extremity tenderness nor edema.  No joint effusions. Neurologic:  Normal speech and language. No gross focal neurologic deficits are appreciated. No gait instability. Skin:  Skin is warm, dry and intact. No rash noted. Psychiatric: Mood and affect  are normal. Speech and behavior are normal.  ____________________________________________   LABS (all labs ordered are listed, but only abnormal results are displayed)  Labs Reviewed  CBC WITH DIFFERENTIAL/PLATELET - Abnormal; Notable for the following components:      Result Value   WBC 12.1 (*)    Neutro Abs 10.9 (*)    Lymphs Abs 0.6 (*)    All other components within normal limits  BASIC METABOLIC PANEL - Abnormal; Notable for the following components:   Glucose, Bld 120 (*)    All other components  within normal limits  URINALYSIS, ROUTINE W REFLEX MICROSCOPIC - Abnormal; Notable for the following components:   Color, Urine YELLOW (*)    APPearance CLEAR (*)    All other components within normal limits  HEPATIC FUNCTION PANEL - Abnormal; Notable for the following components:   AST 46 (*)    Total Bilirubin 1.5 (*)    Indirect Bilirubin 1.3 (*)    All other components within normal limits  LIPASE, BLOOD   ____________________________________________  EKG  None ____________________________________________  RADIOLOGY I, Paxtyn Boyar J, personally viewed and evaluated these images (plain radiographs) as part of my medical decision making, as well as reviewing the written report by the radiologist.  ED MD interpretation: None  Official radiology report(s): No results found.  ____________________________________________   PROCEDURES  Procedure(s) performed (including Critical Care):  Procedures   ____________________________________________   INITIAL IMPRESSION / ASSESSMENT AND PLAN / ED COURSE  As part of my medical decision making, I reviewed the following data within the electronic MEDICAL RECORD NUMBER Nursing notes reviewed and incorporated, Labs reviewed, Old chart reviewed and Notes from prior ED visits     30 year old male presenting with abdominal pain, nausea and vomiting. Differential diagnosis includes, but is not limited to, acute appendicitis, renal colic, testicular torsion, urinary tract infection/pyelonephritis, prostatitis,  epididymitis, diverticulitis, small bowel obstruction or ileus, colitis, abdominal aortic aneurysm, gastroenteritis, hernia, etc.  Laboratory results demonstrate mild leukocytosis, otherwise unremarkable.  Patient overall feeling better but generally weak compared to usual.  Able to tolerate Gatorade without emesis.  Will initiate IV fluid resuscitation, IV antiemetic and reassess.  Clinical Course as of 10/01/20 0121  Thu Oct 01, 2020   0105 Patient feeling significantly better.  Tolerating Gatorade without emesis.  IV fluids completed.  Strict return precautions given.  Patient verbalizes understanding agrees with plan of care. [JS]    Clinical Course User Index [JS] Irean Hong, MD     ____________________________________________   FINAL CLINICAL IMPRESSION(S) / ED DIAGNOSES  Final diagnoses:  Abdominal pain, unspecified abdominal location  Non-intractable vomiting with nausea, unspecified vomiting type     ED Discharge Orders         Ordered    ondansetron (ZOFRAN ODT) 4 MG disintegrating tablet  Every 8 hours PRN        10/01/20 0106          *Please note:  Francisco Mclaughlin was evaluated in Emergency Department on 10/01/2020 for the symptoms described in the history of present illness. He was evaluated in the context of the global COVID-19 pandemic, which necessitated consideration that the patient might be at risk for infection with the SARS-CoV-2 virus that causes COVID-19. Institutional protocols and algorithms that pertain to the evaluation of patients at risk for COVID-19 are in a state of rapid change based on information released by regulatory bodies including the CDC and federal and state organizations. These policies and algorithms were followed during  the patient's care in the ED.  Some ED evaluations and interventions may be delayed as a result of limited staffing during and the pandemic.*   Note:  This document was prepared using Dragon voice recognition software and may include unintentional dictation errors.   Irean Hong, MD 10/01/20 [phone]

## 2020-09-30 NOTE — ED Triage Notes (Addendum)
Pt states he started vomiting today, pt states he has aprox 5 times today. Pt states he has minimal abd pain, denies diarrhea. Pt drinking Gatorade in triage and states he has been able to keep it down.

## 2020-10-01 MED ORDER — ONDANSETRON 4 MG PO TBDP: 4.0000 mg | ORAL_TABLET | Freq: Three times a day (TID) | ORAL | 0 refills | Status: DC | PRN

## 2020-10-01 NOTE — Discharge Instructions (Addendum)
You may take Zofran as needed for nausea/vomiting.  Clear liquids x 12 hours, then bland diet x 3 days, then slowly advance diet as tolerated.  Return to the ER for worsening symptoms, persistent vomiting, difficulty breathing or other concerns. 

## 2020-10-21 ENCOUNTER — Ambulatory Visit: Payer: Self-pay | Admitting: Physician Assistant

## 2020-10-21 ENCOUNTER — Encounter: Payer: Self-pay | Admitting: Physician Assistant

## 2020-10-21 ENCOUNTER — Other Ambulatory Visit: Payer: Self-pay

## 2020-10-21 DIAGNOSIS — Z113 Encounter for screening for infections with a predominantly sexual mode of transmission: Secondary | ICD-10-CM

## 2020-10-21 LAB — GRAM STAIN

## 2020-10-21 NOTE — Progress Notes (Signed)
   [facility] Department STI clinic/screening visit  Subjective:  Francisco Mclaughlin is a 30 y.o. male being seen today for an STI screening visit. The patient reports they do not have symptoms.    Patient has the following medical conditions:  There are no problems to display for this patient.    Chief Complaint  Patient presents with  . SEXUALLY TRANSMITTED DISEASE    screening    HPI  Patient reports that he does not have any symptoms but would like a screening today.  Denies chronic conditions, surgeries and regular medicines.  States last HIV test was in 2020 and last void prior to sample collection for Gram stain was about 2 hr ago.   See flowsheet for further details and programmatic requirements.    The following portions of the patient's history were reviewed and updated as appropriate: allergies, current medications, past medical history, past social history, past surgical history and problem list.  Objective:  There were no vitals filed for this visit.  Physical Exam Constitutional:      General: He is not in acute distress.    Appearance: Normal appearance.  HENT:     Head: Normocephalic and atraumatic.     Comments: No nits,lice, or hair loss. No cervical, supraclavicular or axillary adenopathy.    Mouth/Throat:     Mouth: Mucous membranes are moist.     Pharynx: Oropharynx is clear. No oropharyngeal exudate or posterior oropharyngeal erythema.  Eyes:     Conjunctiva/sclera: Conjunctivae normal.  Pulmonary:     Effort: Pulmonary effort is normal.  Abdominal:     Palpations: Abdomen is soft. There is no mass.     Tenderness: There is no abdominal tenderness. There is no guarding or rebound.  Genitourinary:    Penis: Normal.      Testes: Normal.     Comments: Pubic area without nits, lice, hair loss, edema, erythema, lesions and inguinal adenopathy. Penis circumcised without rash, lesions and discharge at meatus. Testicles descended  bilaterally,nt, no masses or edema. Musculoskeletal:     Cervical back: Neck supple. No tenderness.  Skin:    General: Skin is warm and dry.     Findings: No bruising, erythema, lesion or rash.  Neurological:     Mental Status: He is alert and oriented to person, place, and time.  Psychiatric:        Mood and Affect: Mood normal.        Behavior: Behavior normal.        Thought Content: Thought content normal.        Judgment: Judgment normal.       Assessment and Plan:  Francisco Mclaughlin is a 30 y.o. male presenting to [facility] Department for STI screening  1. Screening for STD (sexually transmitted disease) Patient into clinic without symptoms.  Reviewed with patient Gram stain results and no treatment is indicated today. Rec condoms with all sex. Await test results.  Counseled that RN will call if needs to RTC for treatment once results are back. - Gram stain - Gonococcus culture - HIV/HCV [facility] - Syphilis Serology, [place] [facility]     No follow-ups on file.  No future appointments.  Matt Holmes, PA

## 2020-10-25 LAB — GONOCOCCUS CULTURE

## 2020-10-27 LAB — HM HEPATITIS C SCREENING LAB: HM Hepatitis Screen: NEGATIVE

## 2020-10-27 LAB — HM HIV SCREENING LAB: HM HIV Screening: NEGATIVE

## 2021-02-16 ENCOUNTER — Ambulatory Visit: Payer: Self-pay

## 2021-02-26 ENCOUNTER — Ambulatory Visit: Payer: Self-pay

## 2021-04-15 ENCOUNTER — Encounter: Payer: Self-pay | Admitting: *Deleted

## 2021-04-15 ENCOUNTER — Other Ambulatory Visit: Payer: Self-pay

## 2021-04-15 DIAGNOSIS — M542 Cervicalgia: Secondary | ICD-10-CM | POA: Insufficient documentation

## 2021-04-15 DIAGNOSIS — R519 Headache, unspecified: Secondary | ICD-10-CM | POA: Insufficient documentation

## 2021-04-15 DIAGNOSIS — Z5321 Procedure and treatment not carried out due to patient leaving prior to being seen by health care provider: Secondary | ICD-10-CM | POA: Insufficient documentation

## 2021-04-15 NOTE — ED Triage Notes (Signed)
Pt reports intermittent pain in left side of head and neck .  No known injury.  No otc meds.  Sx for 1 week.  Pt alert  speech clear.

## 2021-04-16 ENCOUNTER — Emergency Department
Admission: EM | Admit: 2021-04-16 | Discharge: 2021-04-16 | Disposition: A | Discharge status: Home / Self Care | Payer: Self-pay | Attending: Emergency Medicine | Admitting: Emergency Medicine

## 2021-04-16 NOTE — ED Notes (Signed)
No answer when called several times from lobby 

## 2021-09-09 ENCOUNTER — Ambulatory Visit: Payer: Self-pay

## 2021-10-04 ENCOUNTER — Ambulatory Visit: Payer: Self-pay

## 2021-10-04 ENCOUNTER — Ambulatory Visit: Payer: Self-pay | Admitting: Family Medicine

## 2021-10-11 ENCOUNTER — Ambulatory Visit: Payer: Self-pay

## 2022-02-02 ENCOUNTER — Ambulatory Visit: Payer: Self-pay

## 2022-02-05 ENCOUNTER — Emergency Department
Admission: EM | Admit: 2022-02-05 | Discharge: 2022-02-05 | Disposition: A | Discharge status: Home / Self Care | Payer: Self-pay | Attending: Emergency Medicine | Admitting: Emergency Medicine

## 2022-02-05 ENCOUNTER — Emergency Department: Payer: Self-pay

## 2022-02-05 ENCOUNTER — Other Ambulatory Visit: Payer: Self-pay

## 2022-02-05 ENCOUNTER — Encounter: Payer: Self-pay | Admitting: Emergency Medicine

## 2022-02-05 DIAGNOSIS — S5002XA Contusion of left elbow, initial encounter: Secondary | ICD-10-CM | POA: Insufficient documentation

## 2022-02-05 DIAGNOSIS — Y9367 Activity, basketball: Secondary | ICD-10-CM | POA: Insufficient documentation

## 2022-02-05 DIAGNOSIS — W500XXA Accidental hit or strike by another person, initial encounter: Secondary | ICD-10-CM | POA: Insufficient documentation

## 2022-02-05 MED ORDER — KETOROLAC TROMETHAMINE 15 MG/ML IJ SOLN
15.0000 mg | Freq: Once | INTRAMUSCULAR | Status: AC
  Administered 2022-02-05: 15 mg via INTRAVENOUS
  Filled 2022-02-05: qty 1

## 2022-02-05 MED ORDER — HYDROCODONE-ACETAMINOPHEN 5-325 MG PO TABS: 1.0000 | ORAL_TABLET | Freq: Four times a day (QID) | ORAL | 0 refills | Status: DC | PRN

## 2022-02-05 NOTE — ED Provider Notes (Signed)
   [facility] Provider Note    Event Date/Time   First MD Initiated Contact with Patient 02/05/22 1208     (approximate)   History   Arm Injury   HPI  Francisco Mclaughlin is a 31 y.o. male who was playing basketball yesterday and was pushing swinging his arm down and banged his arm against another player's leg.  He played through to the end of the game but now his left elbow is very tender and sore and somewhat swollen.  It hurts to move his fingers but he can move his fingers and has good strength and good sensation capillary refill.  Normal pulses in his wrist      Physical Exam   Triage Vital Signs: ED Triage Vitals [02/05/22 1056]  Enc Vitals Group     BP 137/87     Pulse Rate 88     Resp 20     Temp 97.9 F (36.6 C)     Temp Source Oral     SpO2 97 %     Weight 160 lb (72.6 kg)     Height 5\' 9"  (1.753 m)     Head Circumference      Peak Flow      Pain Score 8     Pain Loc      Pain Edu?      Excl. in GC?     Most recent vital signs: Vitals:   02/05/22 1056  BP: 137/87  Pulse: 88  Resp: 20  Temp: 97.9 F (36.6 C)  SpO2: 97%    General: Awake, no distress.  CV:  Good peripheral perfusion.  Resp:  Normal effort.   Elbow left is very swollen and tender diffusely.   ED Results / Procedures / Treatments   Labs (all labs ordered are listed, but only abnormal results are displayed) Labs Reviewed - No data to display   EKG     RADIOLOGY X-ray read and interpreted by me of the elbow shows both anterior and posterior fat pads.  I do not see an obvious fracture however the fat pads are very suspicious for an occult fracture   PROCEDURES:  Critical Care performed:  Procedures   MEDICATIONS ORDERED IN ED: Medications  ketorolac (TORADOL) 15 MG/ML injection 15 mg (15 mg Intravenous Given 02/05/22 1217)     IMPRESSION / MDM / ASSESSMENT AND PLAN / ED COURSE  I reviewed the triage vital signs and the nursing  notes. Patient has a lot of pain and swelling in his elbow.  X-ray shows both anterior and posterior fat-pad elevation suspicious for occult fracture.  We will get him in a splint and sling and have him follow-up with orthopedics.  Differential diagnosis includes, but is not limited to, elbow sprain or fracture  Patient's presentation is most consistent with acute, uncomplicated illness.      FINAL CLINICAL IMPRESSION(S) / ED DIAGNOSES   Final diagnoses:  Contusion of left elbow, initial encounter     Rx / DC Orders   ED Discharge Orders          Ordered    HYDROcodone-acetaminophen (NORCO/VICODIN) 5-325 MG tablet  Every 6 hours PRN        02/05/22 1300             Note:  This document was prepared using Dragon voice recognition software and may include unintentional dictation errors.   02/07/22, MD 02/05/22 1311

## 2022-02-05 NOTE — ED Triage Notes (Signed)
Pt via POV from home. Pt c/o L elbow pain started last night while he was playing basketball. States that it is also swollen. Pt is A&Ox4 and NAD

## 2022-02-05 NOTE — Discharge Instructions (Addendum)
You have some swelling in the elbow which is very suspicious for an occult fracture.  Please wear this splint.  Wear the sling to help support your arm.  You can use Vicodin 1 pill 4 times a day as needed for pain or Motrin 3 or 4 the over-the-counter pills.  If you use 3 of the over-the-counter Motrin pills you can take them 4 times a day if you take 4 of the over-the-counter Motrin pills at once you can take that 3 times a day.  Take the Motrin with food so it does not upset your stomach.  Please return for increasing pain numbness or tingling in the hand or if your hand changes colors gets blue or pale or cold.  These can be signs of swelling that is increasing enough to cut off the blood flow to your hand and can cause significant injury if not addressed right away.  Please call Dr. Eliane Decree office on Monday.  He is the orthopedic surgeon on-call.  He is very good.  Let his office know that you were seen in the emergency room and have an occult elbow fracture.  They should be out of see you in about a week and continue your treatment.

## 2022-02-07 ENCOUNTER — Encounter: Payer: Self-pay | Admitting: Intensive Care

## 2022-02-07 ENCOUNTER — Emergency Department
Admission: EM | Admit: 2022-02-07 | Discharge: 2022-02-07 | Disposition: A | Discharge status: Home / Self Care | Payer: Self-pay | Attending: Emergency Medicine | Admitting: Emergency Medicine

## 2022-02-07 ENCOUNTER — Other Ambulatory Visit: Payer: Self-pay

## 2022-02-07 DIAGNOSIS — M25422 Effusion, left elbow: Secondary | ICD-10-CM | POA: Insufficient documentation

## 2022-02-07 NOTE — Discharge Instructions (Signed)
Call make an appointment with Dr. Allena Katz who was the orthopedist on-call the day you were initially seen and placed in a splint.  You may take ibuprofen or Tylenol as needed for pain.  Ice and elevation as needed.

## 2022-02-07 NOTE — ED Provider Triage Note (Signed)
Emergency Medicine Provider Triage Evaluation Note  MAKIH STEFANKO , a 31 y.o. male  was evaluated in triage.  Pt complains of possible left elbow fracture.  He was seen in the emergency department on 02/05/2022  Review of Systems  Positive: + OCL splint left arm.   Negative:   Physical Exam  There were no vitals taken for this visit. Gen:   Awake, no distress   Resp:  Normal effort  MSK:   Forearm an OCL splint.  Capillary refills less than 3 seconds, motor or sensory function intact. Other:    Medical Decision Making  Medically screening exam initiated at 1:30 PM.  Appropriate orders placed.  MATTHEW CINA was informed that the remainder of the evaluation will be completed by another provider, this initial triage assessment does not replace that evaluation, and the importance of remaining in the ED until their evaluation is complete.     Tommi Rumps, PA-C 02/07/22 1451

## 2022-02-07 NOTE — ED Provider Notes (Signed)
[facility] Provider Note    None    (approximate)   History   Elbow Pain   HPI  Francisco Mclaughlin is a 31 y.o. male presents to the ED that he was told to return to the emergency department today to have a x-ray or scan of his left elbow.  He was seen in the ED on 02/05/2022 with an injury to his elbow that showed an effusion.  Patient was placed in a splint and told to follow-up with the orthopedist on-call who is Dr. Allena Katz.  Patient states that he did not understand this information.  No further injury to his arm since being splinted.     Physical Exam   Triage Vital Signs: ED Triage Vitals  Enc Vitals Group     BP 02/07/22 1331 117/73     Pulse Rate 02/07/22 1331 77     Resp 02/07/22 1331 20     Temp 02/07/22 1331 98.1 F (36.7 C)     Temp Source 02/07/22 1331 Oral     SpO2 02/07/22 1331 94 %     Weight 02/07/22 1333 160 lb (72.6 kg)     Height 02/07/22 1333 5\' 9"  (1.753 m)     Head Circumference --      Peak Flow --      Pain Score 02/07/22 1332 9     Pain Loc --      Pain Edu? --      Excl. in GC? --     Most recent vital signs: Vitals:   02/07/22 1331  BP: 117/73  Pulse: 77  Resp: 20  Temp: 98.1 F (36.7 C)  SpO2: 94%     General: Awake, no distress.  CV:  Good peripheral perfusion.  Heart regular rate rhythm. Resp:  Normal effort.  Lungs are clear bilaterally. Abd:  No distention.  Other:  Left forearm still in a sugar-tong splint and sling.  Capillary refills less than 3 seconds, motor or sensory function intact.   ED Results / Procedures / Treatments   Labs (all labs ordered are listed, but only abnormal results are displayed) Labs Reviewed - No data to display    PROCEDURES:  Critical Care performed:   Procedures   MEDICATIONS ORDERED IN ED: Medications - No data to display   IMPRESSION / MDM / ASSESSMENT AND PLAN / ED COURSE  I reviewed the triage vital signs and the nursing notes.   Differential  diagnosis includes, but is not limited to, injury left elbow.  30 year old male presents to the ED as he thought that he was told to return to the emergency department for a scan of his elbow.  He actually was given information to follow-up with Dr. 37 who is the orthopedist on-call over the weekend.  Patient states that he did not understand this information and his discharge instructions were shown to him with contact information and the name of the orthopedist.  Patient states that he will make an appointment with Dr. Allena Katz at [facility] as instructed.  Patient was given discharge instructions with Dr. [facility] information about once again today.      Patient's presentation is most consistent with acute, uncomplicated illness.  FINAL CLINICAL IMPRESSION(S) / ED DIAGNOSES   Final diagnoses:  Elbow effusion, left     Rx / DC Orders   ED Discharge Orders     None        Note:  This document was prepared using  [employer] and may include unintentional dictation errors.   Tommi Rumps, PA-C 02/07/22 1450    Chesley Noon, MD 02/07/22 1520

## 2022-02-07 NOTE — ED Triage Notes (Signed)
Patient seen on 02/05/22 for left elbow pain and splinted. Reports he was told to come back to ER a few days later for scan because it was too swollen on 02/05/22 to tell if elbow was broken or not.

## 2022-02-14 ENCOUNTER — Ambulatory Visit: Payer: Self-pay

## 2022-03-28 ENCOUNTER — Encounter: Payer: Self-pay | Admitting: Nurse Practitioner

## 2022-03-28 ENCOUNTER — Ambulatory Visit: Payer: Self-pay | Admitting: Nurse Practitioner

## 2022-03-28 DIAGNOSIS — Z202 Contact with and (suspected) exposure to infections with a predominantly sexual mode of transmission: Secondary | ICD-10-CM

## 2022-03-28 DIAGNOSIS — J45909 Unspecified asthma, uncomplicated: Secondary | ICD-10-CM | POA: Insufficient documentation

## 2022-03-28 DIAGNOSIS — Z113 Encounter for screening for infections with a predominantly sexual mode of transmission: Secondary | ICD-10-CM

## 2022-03-28 LAB — HM HIV SCREENING LAB: HM HIV Screening: NEGATIVE

## 2022-03-28 LAB — HM HEPATITIS C SCREENING LAB: HM Hepatitis Screen: NEGATIVE

## 2022-03-28 MED ORDER — METRONIDAZOLE 500 MG PO TABS: 2000.0000 mg | ORAL_TABLET | Freq: Once | ORAL | 0 refills | Status: AC

## 2022-03-28 NOTE — Progress Notes (Unsigned)
Pt here for STI screening and contact to Trich.  No in-house labs preformed.  Metronidazole 500mg  #4 dispensed.  Pt counseled on medication, side effects, plan of care, and when to contact clinic with questions or concerns.  Pt verbalizes understanding of above and voices no questions or concerns at this time.-Williard Keller, RN

## 2022-03-28 NOTE — Progress Notes (Signed)
[facility] Department STI clinic/screening visit  Subjective:  Francisco Mclaughlin is a 31 y.o. male being seen today for an STI screening visit. The patient reports they do not have symptoms.    Patient has the following medical conditions:   Patient Active Problem List   Diagnosis Date Noted   Asthma 03/28/2022     Chief Complaint  Patient presents with   SEXUALLY TRANSMITTED DISEASE    Screening-treatment  - patient stated he was exposed to trich  Patient stated he is not having any symptoms     HPI  Patient reports to clinic today for STD screening and to be treated as a contact to Trich.  Patient is asymptomatic.   Does t2he patient or their partner desires a pregnancy in the next year? No  Screening for MPX risk: Does the patient have an unexplained rash? No Is the patient MSM? No Does the patient endorse multiple sex partners or anonymous sex partners? Yes Did the patient have close or sexual contact with a person diagnosed with MPX? No Has the patient traveled outside the [place] where MPX is endemic? No Is there a high clinical suspicion for MPX-- evidenced by one of the following No  -Unlikely to be chickenpox  -Lymphadenopathy  -Rash that present in same phase of evolution on any given body part   See flowsheet for further details and programmatic requirements.    There is no immunization history on file for this patient.   The following portions of the patient's history were reviewed and updated as appropriate: allergies, current medications, past medical history, past social history, past surgical history and problem list.  Objective:  There were no vitals filed for this visit.  Physical Exam Constitutional:      Appearance: Normal appearance.  HENT:     Head: Normocephalic. No abrasion, masses or laceration. Hair is normal.     Right Ear: External ear normal.     Left Ear: External ear normal.     Nose: Nose normal.     Mouth/Throat:      Lips: Pink.     Mouth: Mucous membranes are moist. No oral lesions.     Pharynx: No pharyngeal swelling, oropharyngeal exudate, posterior oropharyngeal erythema or uvula swelling.     Tonsils: No tonsillar exudate or tonsillar abscesses.     Comments: No visible signs of dental caries Eyes:     General: Lids are normal.        Right eye: No discharge.        Left eye: No discharge.     Conjunctiva/sclera: Conjunctivae normal.     Right eye: No exudate.    Left eye: No exudate. Abdominal:     General: Abdomen is flat.     Palpations: Abdomen is soft.     Tenderness: There is no abdominal tenderness. There is no rebound.  Genitourinary:    Pubic Area: No rash or pubic lice.      Penis: Normal and circumcised. No erythema or discharge.      Testes: Normal.        Right: Mass or tenderness not present.        Left: Mass or tenderness not present.     Rectum: Normal.     Comments: Discharge amount: none Color: none  Musculoskeletal:     Cervical back: Full passive range of motion without pain, normal range of motion and neck supple.  Lymphadenopathy:     Cervical: No cervical adenopathy.  Right cervical: No superficial, deep or posterior cervical adenopathy.    Left cervical: No superficial, deep or posterior cervical adenopathy.     Upper Body:     Right upper body: No supraclavicular, axillary or epitrochlear adenopathy.     Left upper body: No supraclavicular, axillary or epitrochlear adenopathy.     Lower Body: No right inguinal adenopathy. No left inguinal adenopathy.  Skin:    General: Skin is warm and dry.     Findings: No lesion or rash.  Neurological:     Mental Status: He is alert and oriented to person, place, and time.  Psychiatric:        Attention and Perception: Attention normal.        Mood and Affect: Mood normal.        Speech: Speech normal.        Behavior: Behavior normal. Behavior is cooperative.       Assessment and Plan:  Francisco Mclaughlin is  a 31 y.o. male presenting to the [facility] Department for STI screening  1. Screening examination for venereal disease -31 year old male in clinic for STD screening. -Patient does not have STI symptoms Patient accepted all screenings including urine CT/GC and bloodwork for HIV/RPR.  Patient meets criteria for HepB screening? No. Ordered? No - low risk Patient meets criteria for HepC screening? Yes. Ordered? Yes Recommended condom use with all sex Discussed importance of condom use for STI prevent  Discussed time line for State Lab results and that patient will be called with positive results and encouraged patient to call if he had not heard in 2 weeks Recommended returning for continued or worsening symptoms.    - Chlamydia/GC NAA, Confirmation - HIV/HCV [facility] - Syphilis Serology, [place]    2. Exposure to trichomonas  -Please treat patient today as a contact to Trich.  Advised no sex for 7 days and 7 days after partner is treated.    - metroNIDAZOLE (FLAGYL) 500 MG tablet; Take 4 tablets (2,000 mg total) by mouth once for 1 dose.  Dispense: 4 tablet; Refill: 0   Total time spent: 30 minutes   Return if symptoms worsen or fail to improve.    Glenna Fellows, FNP

## 2022-03-30 LAB — CHLAMYDIA/GC NAA, CONFIRMATION
Chlamydia trachomatis, NAA: NEGATIVE
Neisseria gonorrhoeae, NAA: NEGATIVE

## 2022-04-18 ENCOUNTER — Ambulatory Visit: Payer: Self-pay

## 2022-05-04 ENCOUNTER — Encounter: Payer: Self-pay | Admitting: Nurse Practitioner

## 2022-05-04 ENCOUNTER — Ambulatory Visit: Payer: Self-pay | Admitting: Family Medicine

## 2022-05-04 DIAGNOSIS — Z113 Encounter for screening for infections with a predominantly sexual mode of transmission: Secondary | ICD-10-CM

## 2022-05-04 DIAGNOSIS — Z202 Contact with and (suspected) exposure to infections with a predominantly sexual mode of transmission: Secondary | ICD-10-CM

## 2022-05-04 LAB — HM HIV SCREENING LAB: HM HIV Screening: NEGATIVE

## 2022-05-04 LAB — HM HEPATITIS C SCREENING LAB: HM Hepatitis Screen: NEGATIVE

## 2022-05-04 LAB — HEPATITIS B SURFACE ANTIGEN: Hepatitis B Surface Ag: NONREACTIVE

## 2022-05-04 MED ORDER — METRONIDAZOLE 500 MG PO TABS: 2000.0000 mg | ORAL_TABLET | Freq: Once | ORAL | 0 refills | Status: AC

## 2022-05-04 NOTE — Progress Notes (Signed)
[facility] Department STI clinic/screening visit  Subjective:  Francisco Mclaughlin is a 31 y.o. male being seen today for an STI screening visit. The patient reports they do not have symptoms.    Patient has the following medical conditions:   Patient Active Problem List   Diagnosis Date Noted   Asthma 03/28/2022     Chief Complaint  Patient presents with   SEXUALLY TRANSMITTED DISEASE    Treatment- patient stated he needs treatment for trich, he was previously treated but did not follow the wait time and was re exposed to the std     Last HIV test per patient/review of record was: Lab Results  Component Value Date   HMHIVSCREEN Negative - Validated 03/28/2022    Does the patient or their partner desires a pregnancy in the next year? No  Screening for MPX risk: Does the patient have an unexplained rash? No Is the patient MSM? No Does the patient endorse multiple sex partners or anonymous sex partners? No Did the patient have close or sexual contact with a person diagnosed with MPX? No Has the patient traveled outside the [place] where MPX is endemic? No Is there a high clinical suspicion for MPX-- evidenced by one of the following No  -Unlikely to be chickenpox  -Lymphadenopathy  -Rash that present in same phase of evolution on any given body part   See flowsheet for further details and programmatic requirements.    There is no immunization history on file for this patient.   The following portions of the patient's history were reviewed and updated as appropriate: allergies, current medications, past medical history, past social history, past surgical history and problem list.  Objective:    Physical Exam Constitutional:      Appearance: Normal appearance.  HENT:     Head: Normocephalic and atraumatic.     Comments: No nits or hair loss    Mouth/Throat:     Mouth: Mucous membranes are moist. No oral lesions.     Pharynx: Oropharynx is clear. No  oropharyngeal exudate or posterior oropharyngeal erythema.  Eyes:     General:        Right eye: No discharge.        Left eye: No discharge.     Conjunctiva/sclera:     Right eye: Right conjunctiva is not injected. No exudate.    Left eye: Left conjunctiva is not injected. No exudate. Pulmonary:     Effort: Pulmonary effort is normal.  Abdominal:     General: Abdomen is flat.     Palpations: Abdomen is soft. There is no hepatomegaly or mass.     Tenderness: There is no abdominal tenderness. There is no rebound.     Hernia: There is no hernia in the left inguinal area or right inguinal area.  Genitourinary:    Pubic Area: No rash or pubic lice.      Penis: Normal and circumcised. No tenderness, discharge, swelling or lesions.      Testes: Normal.     Epididymis:     Right: Normal. No mass or tenderness.     Left: Normal. No mass or tenderness.     Tanner stage (genital): 5.     Rectum: Normal. No tenderness (no lesions or discharge).     Comments: Penile Discharge Amount: none Lymphadenopathy:     Head:     Right side of head: No preauricular or posterior auricular adenopathy.     Left side of head: No  preauricular or posterior auricular adenopathy.     Cervical: No cervical adenopathy.     Upper Body:     Right upper body: No supraclavicular, axillary or epitrochlear adenopathy.     Left upper body: No supraclavicular, axillary or epitrochlear adenopathy.     Lower Body: No right inguinal adenopathy. No left inguinal adenopathy.  Skin:    General: Skin is warm and dry.     Findings: No erythema, lesion or rash.  Neurological:     Mental Status: He is alert and oriented to person, place, and time.       Assessment and Plan:  Francisco Mclaughlin is a 31 y.o. male presenting to the [facility] Department for STI screening  1. Screening examination for venereal disease Patient desired screening for STI.  Patient does not have STI symptoms Patient accepted  all screenings including  oral, urine, CT/GC and bloodwork for HIV/RPR.  Patient meets criteria for HepB screening? Yes. Ordered? Yes Patient meets criteria for HepC screening? Yes. Ordered? Yes Recommended condom use with all sex Discussed importance of condom use for STI prevent  Treat gram stain per standing order Discussed time line for State Lab results and that patient will be called with positive results and encouraged patient to call if he had not heard in 2 weeks Recommended returning for continued or worsening symptoms.    - Chlamydia/GC NAA, Confirmation - HIV/HCV [place] [facility] - Syphilis Serology, [facility] - HBV Antigen/Antibody [facility]  2. Exposure to trichomonas Patient seen last month for exposure to trich. Was prescribed metronidazole, and finished the medication. He did not wait 7 days before having sex with his trich positive partner. Desires to be retreated for trich today. Counseling given on waiting for 7 days after his treatment and 7 days after his partner finishes treatment for trich. Patient verbalized understanding.  - metroNIDAZOLE (FLAGYL) 500 MG tablet; Take 4 tablets (2,000 mg total) by mouth once for 1 dose.  Dispense: 4 tablet; Refill: 0     Return if symptoms worsen or fail to improve.  Total time spent 20 minutes.   Visit done with supervision from [place], FNP  Lenice Llamas, [place]

## 2022-05-04 NOTE — Progress Notes (Signed)
Pt appointment for STD screening and treatment. Seen by FNP White. Metronidazole dispensed per order and instructions given.

## 2022-05-08 LAB — CHLAMYDIA/GC NAA, CONFIRMATION
Chlamydia trachomatis, NAA: NEGATIVE
Neisseria gonorrhoeae, NAA: NEGATIVE

## 2022-07-08 ENCOUNTER — Emergency Department
Admission: EM | Admit: 2022-07-08 | Discharge: 2022-07-08 | Disposition: A | Discharge status: Home / Self Care | Payer: Self-pay | Attending: Student in an Organized Health Care Education/Training Program | Admitting: Student in an Organized Health Care Education/Training Program

## 2022-07-08 ENCOUNTER — Other Ambulatory Visit: Payer: Self-pay

## 2022-07-08 ENCOUNTER — Encounter: Payer: Self-pay | Admitting: Emergency Medicine

## 2022-07-08 DIAGNOSIS — J101 Influenza due to other identified influenza virus with other respiratory manifestations: Secondary | ICD-10-CM | POA: Insufficient documentation

## 2022-07-08 DIAGNOSIS — J111 Influenza due to unidentified influenza virus with other respiratory manifestations: Secondary | ICD-10-CM

## 2022-07-08 DIAGNOSIS — J45909 Unspecified asthma, uncomplicated: Secondary | ICD-10-CM | POA: Insufficient documentation

## 2022-07-08 DIAGNOSIS — Z20822 Contact with and (suspected) exposure to covid-19: Secondary | ICD-10-CM | POA: Insufficient documentation

## 2022-07-08 LAB — RESP PANEL BY RT-PCR (RSV, FLU A&B, COVID)  RVPGX2
Influenza A by PCR: NEGATIVE
Influenza B by PCR: POSITIVE — AB
Resp Syncytial Virus by PCR: NEGATIVE
SARS Coronavirus 2 by RT PCR: NEGATIVE

## 2022-07-08 MED ORDER — ONDANSETRON HCL 4 MG PO TABS: 4.0000 mg | ORAL_TABLET | Freq: Three times a day (TID) | ORAL | 0 refills | Status: DC | PRN

## 2022-07-08 MED ORDER — ACETAMINOPHEN 325 MG PO TABS
650.0000 mg | ORAL_TABLET | Freq: Once | ORAL | Status: AC
  Administered 2022-07-08: 650 mg via ORAL
  Filled 2022-07-08: qty 2

## 2022-07-08 MED ORDER — ONDANSETRON 4 MG PO TBDP
4.0000 mg | ORAL_TABLET | Freq: Once | ORAL | Status: AC
  Administered 2022-07-08: 4 mg via ORAL
  Filled 2022-07-08: qty 1

## 2022-07-08 MED ORDER — IBUPROFEN 600 MG PO TABS
600.0000 mg | ORAL_TABLET | Freq: Once | ORAL | Status: AC
  Administered 2022-07-08: 600 mg via ORAL
  Filled 2022-07-08: qty 1

## 2022-07-08 NOTE — ED Triage Notes (Signed)
Pt sts that he has been having flu s/s. Pt sts that it has been going on for two to three days.

## 2022-07-08 NOTE — Discharge Instructions (Signed)
You are positive for the flu.  You may continue to take Tylenol/ibuprofen per package instructions to help with your symptoms.  You may also take the Zofran to help with your nausea, though do not take more than prescribed.  Please return for any new, worsening, or change in symptoms or other concerns.  It was a pleasure caring for you today.

## 2022-07-08 NOTE — ED Provider Notes (Signed)
[facility] Provider Note    Event Date/Time   First MD Initiated Contact with Patient 07/08/22 1219     (approximate)   History   Influenza   HPI  Francisco Mclaughlin is a 32 y.o. male who presents today for evaluation of headache, body aches, nasal congestion, and cough for the past 4 days.  He reports that his kids are sick with the same symptoms.  He denies fevers or chills.  He did not get a flu shot this year.  No chest pain, shortness breath, abdominal pain.  He took cold and flu medicine approximately 8 hours ago.  Patient Active Problem List   Diagnosis Date Noted   Asthma 03/28/2022          Physical Exam   Triage Vital Signs: ED Triage Vitals  Enc Vitals Group     BP 07/08/22 1213 126/79     Pulse Rate 07/08/22 1213 78     Resp 07/08/22 1213 18     Temp 07/08/22 1213 99.3 F (37.4 C)     Temp Source 07/08/22 1213 Oral     SpO2 07/08/22 1213 98 %     Weight 07/08/22 1212 165 lb (74.8 kg)     Height --      Head Circumference --      Peak Flow --      Pain Score 07/08/22 1212 10     Pain Loc --      Pain Edu? --      Excl. in [place]? --     Most recent vital signs: Vitals:   07/08/22 1213 07/08/22 1300  BP: 126/79 132/82  Pulse: 78 78  Resp: 18 18  Temp: 99.3 F (37.4 C) 99.3 F (37.4 C)  SpO2: 98% 98%    Physical Exam Vitals and nursing note reviewed.  Constitutional:      General: Awake and alert. No acute distress.    Appearance: Normal appearance. The patient is normal weight.  HENT:     Head: Normocephalic and atraumatic.     Mouth: Mucous membranes are moist.  Nasal congestion present Eyes:     General: PERRL. Normal EOMs        Right eye: No discharge.        Left eye: No discharge.     Conjunctiva/sclera: Conjunctivae normal.  Cardiovascular:     Rate and Rhythm: Normal rate and regular rhythm.     Pulses: Normal pulses.  Pulmonary:     Effort: Pulmonary effort is normal. No respiratory distress.      Breath sounds: Normal breath sounds.  Abdominal:     Abdomen is soft. There is no abdominal tenderness. No rebound or guarding. No distention. Musculoskeletal:        General: No swelling. Normal range of motion.     Cervical back: Normal range of motion and neck supple.  No nuchal rigidity Skin:    General: Skin is warm and dry.     Capillary Refill: Capillary refill takes less than 2 seconds.     Findings: No rash.  Neurological:     Mental Status: The patient is awake and alert.      ED Results / Procedures / Treatments   Labs (all labs ordered are listed, but only abnormal results are displayed) Labs Reviewed  RESP PANEL BY RT-PCR (RSV, FLU A&B, COVID)  RVPGX2 - Abnormal; Notable for the following components:      Result Value  Influenza B by PCR POSITIVE (*)    All other components within normal limits     EKG     RADIOLOGY     PROCEDURES:  Critical Care performed:   Procedures   MEDICATIONS ORDERED IN ED: Medications  acetaminophen (TYLENOL) tablet 650 mg (650 mg Oral Given 07/08/22 1245)  ibuprofen (ADVIL) tablet 600 mg (600 mg Oral Given 07/08/22 1245)  ondansetron (ZOFRAN-ODT) disintegrating tablet 4 mg (4 mg Oral Given 07/08/22 1245)     IMPRESSION / MDM / ASSESSMENT AND PLAN / ED COURSE  I reviewed the triage vital signs and the nursing notes.   Differential diagnosis includes, but is not limited to, influenza, COVID, RSV, bronchitis, pneumonia.  Patient is awake and alert, hemodynamically stable and afebrile.  He is nontoxic in appearance.  He was treated symptomatically with improvement of his symptoms.  Swab obtained in triage is positive for influenza B.  We discussed these results.  He is out of the window for treatment with Tamiflu.  We discussed other symptomatic management and return precautions.  He was offered a work note but declined.  He was advised that he is highly contagious to others.  Patient understands and agrees with plan.  He was  discharged in stable condition.   Patient's presentation is most consistent with acute complicated illness / injury requiring diagnostic workup.     FINAL CLINICAL IMPRESSION(S) / ED DIAGNOSES   Final diagnoses:  Influenza     Rx / DC Orders   ED Discharge Orders          Ordered    ondansetron (ZOFRAN) 4 MG tablet  Every 8 hours PRN        07/08/22 1306             Note:  This document was prepared using Dragon voice recognition software and may include unintentional dictation errors.   Emeline Gins 07/08/22 1509    Merlyn Lot, MD 07/08/22 [phone]

## 2022-08-25 DIAGNOSIS — Z20822 Contact with and (suspected) exposure to covid-19: Secondary | ICD-10-CM | POA: Diagnosis not present

## 2022-08-25 DIAGNOSIS — R112 Nausea with vomiting, unspecified: Secondary | ICD-10-CM | POA: Diagnosis not present

## 2022-12-01 ENCOUNTER — Encounter: Payer: Self-pay | Admitting: Family Medicine

## 2022-12-01 ENCOUNTER — Ambulatory Visit: Payer: Self-pay | Admitting: Family Medicine

## 2022-12-01 DIAGNOSIS — Z113 Encounter for screening for infections with a predominantly sexual mode of transmission: Secondary | ICD-10-CM

## 2022-12-01 LAB — HM HEPATITIS C SCREENING LAB: HM Hepatitis Screen: NEGATIVE

## 2022-12-01 LAB — HEPATITIS B SURFACE ANTIGEN: Hepatitis B Surface Ag: NONREACTIVE

## 2022-12-01 LAB — HM HIV SCREENING LAB: HM HIV Screening: NEGATIVE

## 2022-12-01 NOTE — Progress Notes (Signed)
Pt is here for STD screening.  Condoms given.  Paylin Hailu M Tyge Somers, RN  

## 2022-12-01 NOTE — Progress Notes (Signed)
[facility] Department STI clinic/screening visit  Subjective:  RONIT MARCZAK is a 32 y.o. male being seen today for an STI screening visit. The patient reports they do not have symptoms.    Patient has the following medical conditions:   Patient Active Problem List   Diagnosis Date Noted   Asthma 03/28/2022     Chief Complaint  Patient presents with   SEXUALLY TRANSMITTED DISEASE    No symptoms    HPI  Patient reports to clinic for STI testing.  Last HIV test per patient/review of record was  Lab Results  Component Value Date   HMHIVSCREEN Negative - Validated 05/04/2022   No results found for: "HIV"  Does the patient or their partner desires a pregnancy in the next year? No  Screening for MPX risk: Does the patient have an unexplained rash? No Is the patient MSM? No Does the patient endorse multiple sex partners or anonymous sex partners? No Did the patient have close or sexual contact with a person diagnosed with MPX? No Has the patient traveled outside the [place] where MPX is endemic? No Is there a high clinical suspicion for MPX-- evidenced by one of the following No  -Unlikely to be chickenpox  -Lymphadenopathy  -Rash that present in same phase of evolution on any given body part   See flowsheet for further details and programmatic requirements.    There is no immunization history on file for this patient.   The following portions of the patient's history were reviewed and updated as appropriate: allergies, current medications, past medical history, past social history, past surgical history and problem list.  Objective:  There were no vitals filed for this visit.  Physical Exam Vitals and nursing note reviewed.  Constitutional:      Appearance: Normal appearance.  HENT:     Head: Normocephalic and atraumatic.     Mouth/Throat:     Mouth: Mucous membranes are moist.     Pharynx: No oropharyngeal exudate or posterior oropharyngeal  erythema.  Eyes:     General:        Right eye: No discharge.        Left eye: No discharge.     Conjunctiva/sclera:     Right eye: Right conjunctiva is not injected. No exudate.    Left eye: Left conjunctiva is not injected. No exudate. Pulmonary:     Effort: Pulmonary effort is normal.  Abdominal:     General: Abdomen is flat.     Palpations: Abdomen is soft. There is no hepatomegaly or mass.     Tenderness: There is no abdominal tenderness. There is no rebound.  Genitourinary:    Comments: Declined genital exam- asymptomatic Lymphadenopathy:     Cervical: No cervical adenopathy.     Upper Body:     Right upper body: No supraclavicular or axillary adenopathy.     Left upper body: No supraclavicular or axillary adenopathy.  Skin:    General: Skin is warm and dry.  Neurological:     Mental Status: He is alert and oriented to person, place, and time.       Assessment and Plan:  KASCH BORQUEZ is a 32 y.o. male presenting to the [facility] Department for STI screening  1. Screening for venereal disease Asymptomatic- routine screening  - HIV/HCV [facility] - HBV Antigen/Antibody [facility] - Syphilis Serology, [facility] - Chlamydia/GC NAA, Confirmation   Patient does not have STI symptoms Patient accepted  all screenings including  urine GC/Chlamydia, and blood work for HIV/Syphilis. Patient meets criteria for HepB screening? Yes. Ordered? yes Patient meets criteria for HepC screening? Yes. Ordered? yes Recommended condom use with all sex Discussed importance of condom use for STI prevent  Treat positive test results per standing order. Discussed time line for State Lab results and that patient will be called with positive results and encouraged patient to call if he had not heard in 2 weeks Recommended repeat testing in 3 months with positive results. Recommended returning for continued or worsening symptoms.   Return if symptoms worsen or fail  to improve, for STI screening.  No future appointments. Total time spent 20 minutes Lenice Llamas, [place]

## 2022-12-05 LAB — CHLAMYDIA/GC NAA, CONFIRMATION
Chlamydia trachomatis, NAA: NEGATIVE
Neisseria gonorrhoeae, NAA: NEGATIVE

## 2023-02-27 ENCOUNTER — Ambulatory Visit: Payer: Self-pay

## 2023-06-21 ENCOUNTER — Other Ambulatory Visit: Payer: Self-pay

## 2023-06-21 ENCOUNTER — Emergency Department (HOSPITAL_COMMUNITY): Payer: Self-pay

## 2023-06-21 ENCOUNTER — Emergency Department (HOSPITAL_COMMUNITY)
Admission: EM | Admit: 2023-06-21 | Discharge: 2023-06-21 | Disposition: A | Discharge status: Home / Self Care | Payer: Self-pay | Attending: Emergency Medicine | Admitting: Emergency Medicine

## 2023-06-21 DIAGNOSIS — M545 Low back pain, unspecified: Secondary | ICD-10-CM | POA: Insufficient documentation

## 2023-06-21 MED ORDER — CYCLOBENZAPRINE HCL 10 MG PO TABS
5.0000 mg | ORAL_TABLET | Freq: Every day | ORAL | 0 refills | Status: AC
Start: 2023-06-21 — End: 2023-06-28

## 2023-06-21 NOTE — Discharge Instructions (Addendum)
 The x-ray of your back was normal today.  No fractures or dislocations.  Please engage in light physical activity (like walking) to prevent your pain from worsening and to prevent stiffness. Refrain from bedrest which can make your pain worse. Heating packs may also help with pain.  You may use up to 800mg  ibuprofen  every 8 hours as needed for pain.  Do not exceed 2.4g of ibuprofen  per day.  Alternatively, you may take up to 1000mg  of tylenol  every 6 hours as needed for pain.  Do not take more then 4g per day.  You may use a heating back on your back to help with the pain.  You have been prescribed a muscle relaxer called Flexeril  (cyclobenzaprine ). You may take 0.5 - 1 tablet (5-10mg ) before bed as needed for muscle pain. This medication can be sedating. Do not drive or operate heavy machinery after taking this medicine. Do not drink alcohol or take other sedating medications when taking this medicine for safety reasons.  Keep this out of reach of small children.     Please contact a physical therapy office to set up an appointment for physical therapy.   Return to the ER if you have severe headache not relieved by tylenol  or ibuprofen , uncontrolled vomiting, numbness in your arms or legs, any other new or concerning symptoms.
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
## 2023-06-29 ENCOUNTER — Telehealth: Payer: Self-pay | Admitting: *Deleted

## 2023-06-29 NOTE — Progress Notes (Signed)
 Transition Care Management Unsuccessful Follow-up Telephone Call  Date of discharge and from where:  [facility]   06/20/2022  Attempts:  1st Attempt  Reason for unsuccessful TCM follow-up call:  No answer/busy   Call can not be completed

## 2023-06-29 NOTE — Progress Notes (Signed)
 Transition Care Management Unsuccessful Follow-up Telephone Call  Date of discharge and from where:  [facility]/06[facility]2023  Attempts:  2nd Attempt  Reason for unsuccessful TCM follow-up call:  Missing or invalid number number unavailable

## 2023-07-10 DIAGNOSIS — Z87891 Personal history of nicotine dependence: Secondary | ICD-10-CM | POA: Diagnosis not present

## 2023-07-10 DIAGNOSIS — Z1152 Encounter for screening for COVID-19: Secondary | ICD-10-CM | POA: Diagnosis not present

## 2023-07-10 DIAGNOSIS — R112 Nausea with vomiting, unspecified: Secondary | ICD-10-CM | POA: Diagnosis not present

## 2023-07-10 DIAGNOSIS — R197 Diarrhea, unspecified: Secondary | ICD-10-CM | POA: Diagnosis not present

## 2023-09-05 ENCOUNTER — Ambulatory Visit: Payer: Self-pay

## 2023-10-04 ENCOUNTER — Ambulatory Visit: Payer: Self-pay

## 2023-10-05 ENCOUNTER — Encounter: Payer: Self-pay | Admitting: Nurse Practitioner

## 2023-10-05 ENCOUNTER — Ambulatory Visit: Payer: Self-pay | Admitting: Nurse Practitioner

## 2023-10-05 DIAGNOSIS — Z113 Encounter for screening for infections with a predominantly sexual mode of transmission: Secondary | ICD-10-CM

## 2023-10-05 LAB — HM HIV SCREENING LAB: HM HIV Screening: NEGATIVE

## 2023-10-05 LAB — HEPATITIS B SURFACE ANTIGEN

## 2023-10-05 LAB — HM HEPATITIS C SCREENING LAB: HM Hepatitis Screen: NEGATIVE

## 2023-10-05 NOTE — Progress Notes (Signed)
 [facility] Department STI clinic [address], [address] [place] [address] Main phone: [phone]  STI screening visit  Subjective:  Francisco Mclaughlin is a 33 y.o. male being seen today for an STI screening visit. The patient reports they do not have symptoms.    Patient has the following medical conditions:  Patient Active Problem List   Diagnosis Date Noted   Asthma 03/28/2022    Chief Complaint  Patient presents with   SEXUALLY TRANSMITTED DISEASE    HPI HPI Patient reports no symptoms. 2 male partners in the last 2 months.  STI screening history: Last HIV test per patient/review of record was  Lab Results  Component Value Date   HMHIVSCREEN Negative - Validated 12/01/2022   No results found for: "HIV"  Last HEPC test per patient/review of record was  Lab Results  Component Value Date   HMHEPCSCREEN Negative-Validated 12/01/2022   No components found for: "HEPC"   Last HEPB test per patient/review of record was No components found for: "HMHEPBSCREEN"   Fertility: Does the patient or their partner desires a pregnancy in the next year? No  Screening for MPX risk: Does the patient have an unexplained rash? No Is the patient MSM? No Does the patient endorse multiple sex partners or anonymous sex partners? No Did the patient have close or sexual contact with a person diagnosed with MPX? No Has the patient traveled outside the [place] where MPX is endemic? No Is there a high clinical suspicion for MPX-- evidenced by one of the following No  -Unlikely to be chickenpox  -Lymphadenopathy  -Rash that present in same phase of evolution on any given body part   See flowsheet for further details and programmatic requirements.    There is no immunization history on file for this patient.   The following portions of the patient's history were reviewed and updated as appropriate: allergies, current medications, past medical history, past  social history, past surgical history and problem list.  Objective:  There were no vitals filed for this visit.  Physical Exam Vitals and nursing note reviewed.  Constitutional:      Appearance: Normal appearance.  HENT:     Head: Normocephalic and atraumatic.     Mouth/Throat:     Mouth: Mucous membranes are moist.     Pharynx: No oropharyngeal exudate or posterior oropharyngeal erythema.  Eyes:     General:        Right eye: No discharge.        Left eye: No discharge.     Conjunctiva/sclera:     Right eye: Right conjunctiva is not injected. No exudate.    Left eye: Left conjunctiva is not injected. No exudate. Pulmonary:     Effort: Pulmonary effort is normal.  Abdominal:     General: Abdomen is flat.     Palpations: Abdomen is soft. There is no hepatomegaly or mass.     Tenderness: There is no abdominal tenderness. There is no rebound.  Genitourinary:    Comments: Declined genital exam- asymptomatic Lymphadenopathy:     Cervical: No cervical adenopathy.     Upper Body:     Right upper body: No supraclavicular or axillary adenopathy.     Left upper body: No supraclavicular or axillary adenopathy.  Skin:    General: Skin is warm and dry.  Neurological:     Mental Status: He is alert and oriented to person, place, and time.  Psychiatric:  Mood and Affect: Mood normal.        Behavior: Behavior normal.     Assessment and Plan:  [name] is a 33 y.o. male presenting to the [facility] Department for STI screening  1. Screening for venereal disease (Primary)  - Syphilis Serology, [facility] - Chlamydia/GC NAA, Confirmation - HIV/HCV  Lab - HBV Antigen/Antibody State Lab    Patient does not have STI symptoms Patient accepted all screenings including  urine GC/Chlamydia, and blood work for HIV/Syphilis. Patient meets criteria for HepB screening? Yes. Ordered? yes Patient meets criteria for HepC screening? Yes. Ordered?  yes Recommended condom use with all sex Discussed importance of condom use for STI prevention  Treat positive test results per standing order. Discussed time line for State Lab results and that patient will be called with positive results and encouraged patient to call if he had not heard in 2 weeks Recommended repeat testing in 3 months with positive results. Recommended returning for continued or worsening symptoms.   Return in about 3 months (around 01/04/2024), or if symptoms worsen or fail to improve.  No future appointments.  Arnika Larzelere K Mirel Hundal, NP

## 2023-10-05 NOTE — Progress Notes (Signed)
 Pt is here for STD screening. Condoms given. Sonda Primes, RN.

## 2023-10-07 LAB — CHLAMYDIA/GC NAA, CONFIRMATION
Chlamydia trachomatis, NAA: NEGATIVE
Neisseria gonorrhoeae, NAA: NEGATIVE

## 2023-11-24 ENCOUNTER — Emergency Department
Admission: EM | Admit: 2023-11-24 | Discharge: 2023-11-25 | Disposition: A | Discharge status: Home / Self Care | Payer: Self-pay | Attending: Emergency Medicine | Admitting: Emergency Medicine

## 2023-11-24 ENCOUNTER — Encounter: Payer: Self-pay | Admitting: Emergency Medicine

## 2023-11-24 ENCOUNTER — Other Ambulatory Visit: Payer: Self-pay

## 2023-11-24 DIAGNOSIS — R112 Nausea with vomiting, unspecified: Secondary | ICD-10-CM | POA: Insufficient documentation

## 2023-11-24 DIAGNOSIS — J45909 Unspecified asthma, uncomplicated: Secondary | ICD-10-CM | POA: Insufficient documentation

## 2023-11-24 DIAGNOSIS — R197 Diarrhea, unspecified: Secondary | ICD-10-CM | POA: Diagnosis not present

## 2023-11-24 LAB — CBC
HCT: 43.5 % (ref 39.0–52.0)
Hemoglobin: 15 g/dL (ref 13.0–17.0)
MCH: 31.1 pg (ref 26.0–34.0)
MCHC: 34.5 g/dL (ref 30.0–36.0)
MCV: 90.2 fL (ref 80.0–100.0)
Platelets: 170 10*3/uL (ref 150–400)
RBC: 4.82 MIL/uL (ref 4.22–5.81)
RDW: 11.9 % (ref 11.5–15.5)
WBC: 12.1 10*3/uL — ABNORMAL HIGH (ref 4.0–10.5)
nRBC: 0 % (ref 0.0–0.2)

## 2023-11-24 LAB — LIPASE, BLOOD: Lipase: 33 U/L (ref 11–51)

## 2023-11-24 LAB — COMPREHENSIVE METABOLIC PANEL WITH GFR
ALT: 11 U/L (ref 0–44)
AST: 30 U/L (ref 15–41)
Albumin: 4.5 g/dL (ref 3.5–5.0)
Alkaline Phosphatase: 45 U/L (ref 38–126)
Anion gap: 6 (ref 5–15)
BUN: 14 mg/dL (ref 6–20)
CO2: 26 mmol/L (ref 22–32)
Calcium: 9.4 mg/dL (ref 8.9–10.3)
Chloride: 106 mmol/L (ref 98–111)
Creatinine, Ser: 0.92 mg/dL (ref 0.61–1.24)
GFR, Estimated: >60 mL/min (ref >60)
Glucose, Bld: 102 mg/dL — ABNORMAL HIGH (ref 70–99)
Potassium: 4 mmol/L (ref 3.5–5.1)
Sodium: 138 mmol/L (ref 135–145)
Total Bilirubin: 1.6 mg/dL — ABNORMAL HIGH (ref 0.0–1.2)
Total Protein: 7.3 g/dL (ref 6.5–8.1)

## 2023-11-24 MED ORDER — ONDANSETRON HCL 4 MG/2ML IJ SOLN
4.0000 mg | Freq: Once | INTRAMUSCULAR | Status: AC
  Administered 2023-11-25: 4 mg via INTRAVENOUS
  Filled 2023-11-24: qty 2

## 2023-11-24 MED ORDER — KETOROLAC TROMETHAMINE 30 MG/ML IJ SOLN
30.0000 mg | Freq: Once | INTRAMUSCULAR | Status: AC
  Administered 2023-11-25: 30 mg via INTRAVENOUS
  Filled 2023-11-24: qty 1

## 2023-11-24 MED ORDER — PANTOPRAZOLE SODIUM 40 MG IV SOLR
40.0000 mg | Freq: Once | INTRAVENOUS | Status: AC
  Administered 2023-11-25: 40 mg via INTRAVENOUS
  Filled 2023-11-24: qty 10

## 2023-11-24 MED ORDER — SODIUM CHLORIDE 0.9 % IV BOLUS (SEPSIS)
1000.0000 mL | Freq: Once | INTRAVENOUS | Status: AC
  Administered 2023-11-25: 1000 mL via INTRAVENOUS

## 2023-11-24 MED ORDER — LIDOCAINE VISCOUS HCL 2 % MT SOLN
15.0000 mL | Freq: Once | OROMUCOSAL | Status: DC
  Filled 2023-11-24: qty 15
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
## 2023-11-24 NOTE — ED Provider Notes (Signed)
 [facility] Provider Note    Event Date/Time   First MD Initiated Contact with Patient 11/24/23 2300     (approximate)   History   Emesis and Diarrhea   HPI  Francisco Mclaughlin is a 33 y.o. male with history of asthma who presents to the emergency department with complaints of nausea, vomiting and diarrhea that started today.  No sick contacts or recent travel.  Complains of burning in the throat and soreness throughout the abdomen.  No dysuria, hematuria.  No prior abdominal surgeries.   History provided by patient.    Past Medical History:  Diagnosis Date   Asthma     History reviewed. No pertinent surgical history.  MEDICATIONS:  Prior to Admission medications   Medication Sig Start Date End Date Taking? Authorizing Provider  HYDROcodone -acetaminophen  (NORCO/VICODIN) 5-325 MG tablet Take 1 tablet by mouth every 6 (six) hours as needed for moderate pain. Patient not taking: Reported on 03/28/2022 02/05/22   Vianne Grad, MD  ondansetron  (ZOFRAN  ODT) 4 MG disintegrating tablet Take 1 tablet (4 mg total) by mouth every 8 (eight) hours as needed for nausea or vomiting. Patient not taking: Reported on 10/21/2020 10/01/20   Sung, Jade J, MD  ondansetron  (ZOFRAN ) 4 MG tablet Take 1 tablet (4 mg total) by mouth every 8 (eight) hours as needed for nausea or vomiting. 07/08/22   Poggi, Jenna E, PA-C    Physical Exam   Triage Vital Signs: ED Triage Vitals  Encounter Vitals Group     BP 11/24/23 2151 115/78     Systolic BP Percentile --      Diastolic BP Percentile --      Pulse Rate 11/24/23 2151 78     Resp 11/24/23 2151 16     Temp 11/24/23 2151 98.6 F (37 C)     Temp Source 11/24/23 2151 Oral     SpO2 11/24/23 2151 99 %     Weight 11/24/23 2152 160 lb (72.6 kg)     Height 11/24/23 2152 5\' 9"  (1.753 m)     Head Circumference --      Peak Flow --      Pain Score 11/24/23 2150 8     Pain Loc --      Pain Education --      Exclude from Growth  Chart --     Most recent vital signs: Vitals:   11/25/23 0135 11/25/23 0136  BP: 112/74 112/74  Pulse:  75  Resp:  16  Temp:    SpO2:  98%    CONSTITUTIONAL: Alert, responds appropriately to questions. Well-appearing; well-nourished HEAD: Normocephalic, atraumatic EYES: Conjunctivae clear, pupils appear equal, sclera nonicteric ENT: normal nose; moist mucous membranes NECK: Supple, normal ROM CARD: RRR; S1 and S2 appreciated RESP: Normal chest excursion without splinting or tachypnea; breath sounds clear and equal bilaterally; no wheezes, no rhonchi, no rales, no hypoxia or respiratory distress, speaking full sentences ABD/GI: Non-distended; soft, non-tender, no rebound, no guarding, no peritoneal signs, no tenderness at McBurney's point BACK: The back appears normal EXT: Normal ROM in all joints; no deformity noted, no edema SKIN: Normal color for age and race; warm; no rash on exposed skin NEURO: Moves all extremities equally, normal speech PSYCH: The patient's mood and manner are appropriate.   ED Results / Procedures / Treatments   LABS: (all labs ordered are listed, but only abnormal results are displayed) Labs Reviewed  COMPREHENSIVE METABOLIC PANEL WITH GFR - Abnormal; Notable  for the following components:      Result Value   Glucose, Bld 102 (*)    Total Bilirubin 1.6 (*)    All other components within normal limits  CBC - Abnormal; Notable for the following components:   WBC 12.1 (*)    All other components within normal limits  LIPASE, BLOOD     EKG:   RADIOLOGY: My personal review and interpretation of imaging:    I have personally reviewed all radiology reports.   No results found.   PROCEDURES:  Critical Care performed: No     Procedures    IMPRESSION / MDM / ASSESSMENT AND PLAN / ED COURSE  I reviewed the triage vital signs and the nursing notes.    Patient here with vomiting, diarrhea.  Benign abdominal exam.   DIFFERENTIAL  DIAGNOSIS (includes but not limited to):   Viral gastroenteritis, low suspicion for appendicitis, colitis, diverticulitis, pancreatitis, cholecystitis, bowel obstruction based on benign exam   Patient's presentation is most consistent with acute presentation with potential threat to life or bodily function.   PLAN: Labs obtained from triage.  Leukocytosis of 12,000.  Normal electrolytes, creatinine, LFTs, lipase other than total bilirubin of 1.6 which she has had previously.  No upper abdominal tenderness on exam.  Will give additional antiemetics, IV fluids, Toradol  and p.o. challenge.  No indication for emergent imaging at this time.   MEDICATIONS GIVEN IN ED: Medications  lidocaine  (XYLOCAINE ) 2 % viscous mouth solution 15 mL (15 mLs Mouth/Throat Patient Refused/Not Given 11/25/23 0011)  ondansetron  (ZOFRAN ) injection 4 mg (4 mg Intravenous Given 11/25/23 0008)  ketorolac  (TORADOL ) 30 MG/ML injection 30 mg (30 mg Intravenous Given 11/25/23 0008)  sodium chloride  0.9 % bolus 1,000 mL (0 mLs Intravenous Stopped 11/25/23 0237)  pantoprazole  (PROTONIX ) injection 40 mg (40 mg Intravenous Given 11/25/23 0011)  ondansetron  (ZOFRAN ) injection 4 mg (4 mg Intravenous Given 11/25/23 0237)     ED COURSE: Patient reports he is feeling better, tolerating p.o.  Will discharge home with Bentyl, Zofran .  Recommended increase fluid intake, bland diet.  Patient comfortable with this plan.   At this time, I do not feel there is any life-threatening condition present. I reviewed all nursing notes, vitals, pertinent previous records.  All lab and urine results, EKGs, imaging ordered have been independently reviewed and interpreted by myself.  I reviewed all available radiology reports from any imaging ordered this visit.  Based on my assessment, I feel the patient is safe to be discharged home without further emergent workup and can continue workup as an outpatient as needed. Discussed all findings, treatment plan as well  as usual and customary return precautions.  They verbalize understanding and are comfortable with this plan.  Outpatient follow-up has been provided as needed.  All questions have been answered.    CONSULTS:  none   OUTSIDE RECORDS REVIEWED: Reviewed last family medicine note in November 2023.       FINAL CLINICAL IMPRESSION(S) / ED DIAGNOSES   Final diagnoses:  Nausea vomiting and diarrhea     Rx / DC Orders   ED Discharge Orders          Ordered    ondansetron  (ZOFRAN -ODT) 4 MG disintegrating tablet  Every 6 hours PRN        11/25/23 0120    dicyclomine (BENTYL) 20 MG tablet  Every 8 hours PRN        11/25/23 0120  Note:  This document was prepared using Dragon voice recognition software and may include unintentional dictation errors.   Hermelinda Diegel, Clover Dao, DO 11/25/23 [phone]

## 2023-11-24 NOTE — ED Triage Notes (Addendum)
 To ER from home via EMS for report of vomiting and diarrhea at the same time since around noon today. Felt fine prior to that. Denies any fevers or sick contacts, thinks it may be something he ate. Received IV zofran  from EMS and that has improved, patient is feeling better.

## 2023-11-25 DIAGNOSIS — R112 Nausea with vomiting, unspecified: Secondary | ICD-10-CM | POA: Diagnosis not present

## 2023-11-25 MED ORDER — ONDANSETRON 4 MG PO TBDP: 4.0000 mg | ORAL_TABLET | Freq: Four times a day (QID) | ORAL | 0 refills | Status: AC | PRN

## 2023-11-25 MED ORDER — ONDANSETRON HCL 4 MG/2ML IJ SOLN
4.0000 mg | Freq: Once | INTRAMUSCULAR | Status: AC
  Administered 2023-11-25: 4 mg via INTRAVENOUS
  Filled 2023-11-25: qty 2

## 2023-11-25 MED ORDER — DICYCLOMINE HCL 20 MG PO TABS: 20.0000 mg | ORAL_TABLET | Freq: Three times a day (TID) | ORAL | 0 refills | Status: AC | PRN

## 2023-11-25 NOTE — ED Notes (Signed)
 Patient passed PO challenge at this time. MD made aware.

## 2023-11-25 NOTE — Discharge Instructions (Signed)
 You may alternate Tylenol  1000 mg every 6 hours as needed for pain, fever and Ibuprofen  800 mg every 6-8 hours as needed for pain, fever.  Please take Ibuprofen  with food.  Do not take more than 4000 mg of Tylenol  (acetaminophen ) in a 24 hour period.  You may use over the Imodium as needed for diarrhea.  I recommend a bland diet for the next several days and increase fluid intake.

## 2024-01-23 ENCOUNTER — Emergency Department

## 2024-01-23 ENCOUNTER — Emergency Department: Admission: EM | Admit: 2024-01-23 | Discharge: 2024-01-23 | Discharge status: Against Medical Advice

## 2024-01-23 DIAGNOSIS — R0789 Other chest pain: Secondary | ICD-10-CM | POA: Insufficient documentation

## 2024-01-23 DIAGNOSIS — Z5321 Procedure and treatment not carried out due to patient leaving prior to being seen by health care provider: Secondary | ICD-10-CM | POA: Diagnosis not present

## 2024-01-23 DIAGNOSIS — R079 Chest pain, unspecified: Secondary | ICD-10-CM | POA: Diagnosis not present

## 2024-01-23 DIAGNOSIS — R0602 Shortness of breath: Secondary | ICD-10-CM | POA: Insufficient documentation

## 2024-01-23 LAB — CBC
HCT: 40.6 % (ref 39.0–52.0)
Hemoglobin: 13.9 g/dL (ref 13.0–17.0)
MCH: 31.8 pg (ref 26.0–34.0)
MCHC: 34.2 g/dL (ref 30.0–36.0)
MCV: 92.9 fL (ref 80.0–100.0)
Platelets: 183 K/uL (ref 150–400)
RBC: 4.37 MIL/uL (ref 4.22–5.81)
RDW: 12.3 % (ref 11.5–15.5)
WBC: 7.7 K/uL (ref 4.0–10.5)
nRBC: 0 % (ref 0.0–0.2)

## 2024-01-23 LAB — BASIC METABOLIC PANEL WITH GFR
Anion gap: 8 (ref 5–15)
BUN: 11 mg/dL (ref 6–20)
CO2: 31 mmol/L (ref 22–32)
Calcium: 9.1 mg/dL (ref 8.9–10.3)
Chloride: 102 mmol/L (ref 98–111)
Creatinine, Ser: 1.14 mg/dL (ref 0.61–1.24)
GFR, Estimated: >60 mL/min (ref >60)
Glucose, Bld: 92 mg/dL (ref 70–99)
Potassium: 4.1 mmol/L (ref 3.5–5.1)
Sodium: 141 mmol/L (ref 135–145)

## 2024-01-23 LAB — TROPONIN I (HIGH SENSITIVITY): Troponin I (High Sensitivity): 2 ng/L (ref <18)

## 2024-01-23 NOTE — ED Triage Notes (Signed)
 Pt c/o intermittent L sided chest pain and SOB x months.  Pain score 7/10.  Pt has not taken anything for symptoms.  Pt reports pain increases when he lays on his stomach.    NAD noted. Pt easily speaking full sentences.

## 2024-01-23 NOTE — ED Notes (Signed)
 No answer when called several times from lobby

## 2024-06-26 ENCOUNTER — Ambulatory Visit
